# Patient Record
Sex: Female | Born: 1975 | Race: Black or African American | Hispanic: No | Marital: Single | Smoking: Never smoker
Health system: Southern US, Community
[De-identification: ages and names within clinical notes are randomized; demographics above are authoritative.]

## PROBLEM LIST (undated history)

## (undated) DIAGNOSIS — R7303 Prediabetes: Secondary | ICD-10-CM

## (undated) DIAGNOSIS — R739 Hyperglycemia, unspecified: Secondary | ICD-10-CM

## (undated) DIAGNOSIS — N6019 Diffuse cystic mastopathy of unspecified breast: Secondary | ICD-10-CM

## (undated) DIAGNOSIS — I10 Essential (primary) hypertension: Secondary | ICD-10-CM

## (undated) DIAGNOSIS — D649 Anemia, unspecified: Secondary | ICD-10-CM

## (undated) HISTORY — PX: ESOPHAGOGASTRODUODENOSCOPY: SHX1529

## (undated) HISTORY — PX: OTHER SURGICAL HISTORY: SHX169

## (undated) HISTORY — PX: BREAST CYST EXCISION: SHX579

## (undated) HISTORY — PX: CHOLECYSTECTOMY: SHX55

## (undated) HISTORY — PX: ESSURE TUBAL LIGATION: SUR464

## (undated) HISTORY — PX: GASTRIC ROUX-EN-Y: SHX5262

---

## 2012-12-02 ENCOUNTER — Emergency Department: Payer: Self-pay | Admitting: Emergency Medicine

## 2012-12-02 LAB — CBC
MCH: 27.6 pg (ref 26.0–34.0)
MCHC: 33.9 g/dL (ref 32.0–36.0)
Platelet: 340 10*3/uL (ref 150–440)
WBC: 6.8 10*3/uL (ref 3.6–11.0)

## 2012-12-02 LAB — COMPREHENSIVE METABOLIC PANEL
Albumin: 3.7 g/dL (ref 3.4–5.0)
Anion Gap: 12 (ref 7–16)
BUN: 13 mg/dL (ref 7–18)
Calcium, Total: 8.7 mg/dL (ref 8.5–10.1)
Chloride: 109 mmol/L — ABNORMAL HIGH (ref 98–107)
Co2: 20 mmol/L — ABNORMAL LOW (ref 21–32)
Creatinine: 0.78 mg/dL (ref 0.60–1.30)
EGFR (African American): >60
Glucose: 124 mg/dL — ABNORMAL HIGH (ref 65–99)
Osmolality: 283 (ref 275–301)
SGPT (ALT): 22 U/L (ref 12–78)
Sodium: 141 mmol/L (ref 136–145)
Total Protein: 7.9 g/dL (ref 6.4–8.2)

## 2012-12-02 LAB — URINALYSIS, COMPLETE
Glucose,UR: NEGATIVE mg/dL (ref 0–75)
Nitrite: NEGATIVE
RBC,UR: 20 /HPF (ref 0–5)
Squamous Epithelial: 19
WBC UR: 10 /HPF (ref 0–5)

## 2012-12-02 LAB — PREGNANCY, URINE: Pregnancy Test, Urine: NEGATIVE m[IU]/mL

## 2013-12-19 ENCOUNTER — Emergency Department (HOSPITAL_COMMUNITY)
Admission: EM | Admit: 2013-12-19 | Discharge: 2013-12-19 | Disposition: A | Discharge status: Home / Self Care | Payer: BC Managed Care – PPO | Source: Home / Self Care | Attending: Family Medicine | Admitting: Family Medicine

## 2013-12-19 ENCOUNTER — Encounter (HOSPITAL_COMMUNITY): Payer: Self-pay | Admitting: Emergency Medicine

## 2013-12-19 DIAGNOSIS — Z202 Contact with and (suspected) exposure to infections with a predominantly sexual mode of transmission: Secondary | ICD-10-CM

## 2013-12-19 MED ORDER — PENICILLIN G BENZATHINE 1200000 UNIT/2ML IM SUSP
INTRAMUSCULAR | Status: AC
  Filled 2013-12-19: qty 4

## 2013-12-19 MED ORDER — PENICILLIN G BENZATHINE 1200000 UNIT/2ML IM SUSP
2.4000 10*6.[IU] | Freq: Once | INTRAMUSCULAR | Status: AC
  Administered 2013-12-19: 2.4 10*6.[IU] via INTRAMUSCULAR

## 2013-12-19 NOTE — ED Provider Notes (Signed)
CSN: [id number]     Arrival date & time 12/19/13  1705 History   First MD Initiated Contact with Patient 12/19/13 1741     Chief Complaint  Patient presents with  . Exposure to STD     (Consider location/radiation/quality/duration/timing/severity/associated sxs/prior Treatment) HPI Comments: 38 year old female presents for treatment, her boyfriend called her and told her that he had tested positive for syphilis. They have been sexually active during the time where he had a penile lesion. She denies any symptoms herself. All of his other tests including HIV, GC, CH, trichomoniasis, HSV were all negative. She has no drug allergies  Patient is a 38 y.o. female presenting with STD exposure.  Exposure to STD Pertinent negatives include no chest pain, no abdominal pain and no shortness of breath.    History reviewed. No pertinent past medical history. Past Surgical History  Procedure Laterality Date  . Gastric bypass      2010   History reviewed. No pertinent family history. History  Substance Use Topics  . Smoking status: Never Smoker   . Smokeless tobacco: Not on file  . Alcohol Use: No   OB History   Grav Para Term Preterm Abortions TAB SAB Ect Mult Living                 Review of Systems  Constitutional: Negative for fever and chills.  Eyes: Negative for visual disturbance.  Respiratory: Negative for cough and shortness of breath.   Cardiovascular: Negative for chest pain, palpitations and leg swelling.  Gastrointestinal: Negative for nausea, vomiting and abdominal pain.  Endocrine: Negative for polydipsia and polyuria.  Genitourinary: Negative for dysuria, urgency, frequency, hematuria, flank pain, vaginal bleeding, vaginal discharge, difficulty urinating, genital sores, vaginal pain, menstrual problem, pelvic pain and dyspareunia.  Musculoskeletal: Negative for arthralgias and myalgias.  Skin: Negative for rash.  Neurological: Negative for dizziness, weakness and  light-headedness.      Allergies  Review of patient's allergies indicates no known allergies.  Home Medications  No current outpatient prescriptions on file. BP 135/91  Pulse 102  Temp(Src) 97.8 F (36.6 C) (Oral)  Resp 20  SpO2 100%  LMP 12/06/2013 Physical Exam  Nursing note and vitals reviewed. Constitutional: She is oriented to person, place, and time. Vital signs are normal. She appears well-developed and well-nourished. No distress.  HENT:  Head: Normocephalic and atraumatic.  Pulmonary/Chest: Effort normal. No respiratory distress.  Neurological: She is alert and oriented to person, place, and time. She has normal strength. Coordination normal.  Skin: Skin is warm and dry. No rash noted. She is not diaphoretic.  Psychiatric: She has a normal mood and affect. Judgment normal.    ED Course  Procedures (including critical care time) Labs Review Labs Reviewed  RPR  HIV ANTIBODY (ROUTINE TESTING)   Imaging Review No results found.    MDM   Final diagnoses:  Exposure to syphilis   Given Bicillin, 2.4 million units IM. HIV and RPR sent. Followup when necessary  Meds ordered this encounter  Medications  . penicillin g benzathine (BICILLIN LA) 1200000 UNIT/2ML injection 2.4 Million Units    Sig:     Order Specific Question:  Antibiotic Indication:    Answer:  Syphilis       Liam Graham, PA-C 12/19/13 1758

## 2013-12-19 NOTE — Discharge Instructions (Signed)
Syphilis Syphilis is an infectious disease. It can cause serious complications if left untreated.  CAUSES  Syphilis is caused by a type of bacteria called Treponema pallidum. It is most commonly spread through sexual contact. Syphilis may also spread to a fetus through the blood of the mother.  SIGNS AND SYMPTOMS Symptoms vary depending on the stage of the disease. Some symptoms may disappear without treatment. However, this does not mean that the infection is gone. One form of syphilis (called latent syphilis) has no symptoms.  Primary Syphilis  Painless sores (chancres) in and around the genital organs and mouth.  Swollen lymph nodes near the sores. Secondary Syphilis  A rash or sores over any portion of the body, including the palms of the hands and soles of the feet.  Fever.  Headache.  Sore throat.  Swollen lymph nodes.  New sores in the mouth or on the genitals.  Feeling generally ill.  Having pain in the joints. Tertiary Syphilis The third stage of syphilis involves severe damage to different organs in the body, such as the brain, spinal cord, and heart. Signs and symptoms may include:   Dementia.  Personality and mood changes.  Difficulty walking.  Heart failure.  Fainting.  Enlargement (aneurysm) of the aorta.  Tumors of the skin, bones, or liver.  Muscle weakness.  Sudden "lightning" pains, numbness, or tingling.  Problems with coordination.  Vision changes. DIAGNOSIS   A physical exam will be done.  Blood tests will be done to confirm the diagnosis.  If the disease is in the first or second stages, a fluid (drainage) sample from a sore or rash may be examined under a microscope to detect the disease-causing bacteria.  Fluid around the spine may need to be examined to detect brain damage or inflammation of the brain lining (meningitis).  If the disease is in the third stage, X-rays, CT scans, MRIs, echocardiograms, ultrasounds, or cardiac  catheterization may also be done to detect disease of the heart, aorta, or brain. TREATMENT  Syphilis can be cured with antibiotic medicine if a diagnosis is made early. During the first day of treatment, you may experience fever, chills, headache, nausea, or aching all over your body. This is a normal reaction to the antibiotics.  HOME CARE INSTRUCTIONS   Take antibiotics as directed by your health care provider. Make sure you finish them, even if you start to feel better. Incomplete treatment will put you at risk for continued infection and could be life-threatening.  Only take over-the-counter or prescription medicines for pain, discomfort, or fever as directed by your health care provider.  Do not have sexual intercourse until your treatment is completed or as directed by your health care provider.  Inform your recent sexual partners that you were diagnosed with syphilis. They need to seek care and treatment, even if they have no symptoms. It is necessary that all your sexual partners be tested for infection and treated if they have the disease.  Follow up with your health care provider as directed.  If your test results are not ready during your visit, make an appointment with your health care provider to find out the results. Do not assume everything is normal if you have not heard from your health care provider or the medical facility. It is important for you to follow up on all of your test results. SEEK MEDICAL CARE IF:  You continue to have any of the following 24 hours after beginning treatment:  Fever.  Chills.  Headache.  Nausea.  Aching all over your body.  You have symptoms of an allergic reaction to medicine, such as:  Chills.  A headache.  Lightheadedness.  A new rash (especially hives).  Difficulty breathing. SEEK IMMEDIATE MEDICAL CARE IF:   You have a fever or persistent symptoms for more than 2 3 days.  You have a fever and your symptoms suddenly get  worse. Document Released: 08/16/2013 Document Reviewed: 05/16/2013 [facility] Patient Information 2014 [place], [place].

## 2013-12-19 NOTE — ED Notes (Signed)
Reports being informed by boyfriend today that he tested positive for syphilis.  Denies any symptoms at this time.  States was recently treated for trich.

## 2013-12-20 LAB — RPR: RPR Ser Ql: NONREACTIVE

## 2013-12-20 LAB — HIV ANTIBODY (ROUTINE TESTING W REFLEX): HIV: NONREACTIVE

## 2013-12-20 NOTE — ED Provider Notes (Signed)
Medical screening examination/treatment/procedure(s) were performed by a resident physician or non-physician practitioner and as the supervising physician I was immediately available for consultation/collaboration.  Evan Corey, MD    Evan S Corey, MD 12/20/13 0745 

## 2013-12-21 ENCOUNTER — Telehealth (HOSPITAL_COMMUNITY): Payer: Self-pay | Admitting: *Deleted

## 2013-12-21 NOTE — ED Notes (Signed)
Pt. called for her lab results.  Pt. verified x 2 and given results ( HIV/RPR non-reactive). Roselyn Meier 12/21/2013

## 2014-06-26 ENCOUNTER — Emergency Department: Payer: Self-pay | Admitting: Emergency Medicine

## 2014-09-10 ENCOUNTER — Ambulatory Visit: Payer: Self-pay | Admitting: Physician Assistant

## 2014-10-09 ENCOUNTER — Ambulatory Visit: Payer: Self-pay | Admitting: Physician Assistant

## 2016-06-02 DIAGNOSIS — Z046 Encounter for general psychiatric examination, requested by authority: Secondary | ICD-10-CM | POA: Diagnosis present

## 2016-06-02 DIAGNOSIS — Z79899 Other long term (current) drug therapy: Secondary | ICD-10-CM | POA: Insufficient documentation

## 2016-06-02 DIAGNOSIS — Y808 Miscellaneous physical medicine devices associated with adverse incidents, not elsewhere classified: Secondary | ICD-10-CM | POA: Insufficient documentation

## 2016-06-02 DIAGNOSIS — T402X5A Adverse effect of other opioids, initial encounter: Secondary | ICD-10-CM | POA: Diagnosis not present

## 2016-06-02 DIAGNOSIS — T402X1A Poisoning by other opioids, accidental (unintentional), initial encounter: Secondary | ICD-10-CM | POA: Diagnosis not present

## 2016-06-02 DIAGNOSIS — T887XXA Unspecified adverse effect of drug or medicament, initial encounter: Secondary | ICD-10-CM | POA: Diagnosis not present

## 2016-06-02 NOTE — ED Triage Notes (Signed)
Pt took 4 x hydrocodone at 2215 has been upset about recent breakup and other stressors, denies any SI at this time.

## 2016-06-03 ENCOUNTER — Emergency Department
Admission: EM | Admit: 2016-06-03 | Discharge: 2016-06-03 | Disposition: A | Discharge status: Home / Self Care | Payer: BC Managed Care – PPO | Attending: Emergency Medicine | Admitting: Emergency Medicine

## 2016-06-03 DIAGNOSIS — T3991XA Poisoning by unspecified nonopioid analgesic, antipyretic and antirheumatic, accidental (unintentional), initial encounter: Secondary | ICD-10-CM

## 2016-06-03 LAB — BASIC METABOLIC PANEL
Anion gap: 9 (ref 5–15)
BUN: 19 mg/dL (ref 6–20)
CALCIUM: 8.7 mg/dL — AB (ref 8.9–10.3)
CO2: 21 mmol/L — AB (ref 22–32)
Chloride: 108 mmol/L (ref 101–111)
Creatinine, Ser: 0.99 mg/dL (ref 0.44–1.00)
GFR calc Af Amer: >60 mL/min (ref >60)
GFR calc non Af Amer: >60 mL/min (ref >60)
GLUCOSE: 114 mg/dL — AB (ref 65–99)
Potassium: 3.5 mmol/L (ref 3.5–5.1)
Sodium: 138 mmol/L (ref 135–145)

## 2016-06-03 LAB — CBC
HEMATOCRIT: 31.7 % — AB (ref 35.0–47.0)
Hemoglobin: 10.2 g/dL — ABNORMAL LOW (ref 12.0–16.0)
MCH: 22.6 pg — ABNORMAL LOW (ref 26.0–34.0)
MCHC: 32.1 g/dL (ref 32.0–36.0)
MCV: 70.6 fL — ABNORMAL LOW (ref 80.0–100.0)
Platelets: 448 10*3/uL — ABNORMAL HIGH (ref 150–440)
RBC: 4.49 MIL/uL (ref 3.80–5.20)
RDW: 17.6 % — ABNORMAL HIGH (ref 11.5–14.5)
WBC: 10.4 10*3/uL (ref 3.6–11.0)

## 2016-06-03 LAB — URINE DRUG SCREEN, QUALITATIVE (ARMC ONLY)
Amphetamines, Ur Screen: NOT DETECTED
Barbiturates, Ur Screen: NOT DETECTED
Benzodiazepine, Ur Scrn: NOT DETECTED
Cannabinoid 50 Ng, Ur ~~LOC~~: NOT DETECTED
Cocaine Metabolite,Ur ~~LOC~~: NOT DETECTED
MDMA (ECSTASY) UR SCREEN: NOT DETECTED
Methadone Scn, Ur: NOT DETECTED
Opiate, Ur Screen: POSITIVE — AB
Phencyclidine (PCP) Ur S: NOT DETECTED
TRICYCLIC, UR SCREEN: NOT DETECTED

## 2016-06-03 LAB — URINALYSIS COMPLETE WITH MICROSCOPIC (ARMC ONLY)
Bilirubin Urine: NEGATIVE
GLUCOSE, UA: NEGATIVE mg/dL
Ketones, ur: NEGATIVE mg/dL
Nitrite: NEGATIVE
Protein, ur: NEGATIVE mg/dL
Specific Gravity, Urine: 1.026 (ref 1.005–1.030)
pH: 5 (ref 5.0–8.0)

## 2016-06-03 LAB — ETHANOL: Alcohol, Ethyl (B): <5 mg/dL (ref <5)

## 2016-06-03 LAB — SALICYLATE LEVEL

## 2016-06-03 LAB — ACETAMINOPHEN LEVEL: ACETAMINOPHEN (TYLENOL), SERUM: 14 ug/mL (ref 10–30)

## 2016-06-03 NOTE — ED Provider Notes (Addendum)
[facility] Emergency Department Provider Note  ____________________________________________  Time seen: Approximately 3:48 AM  I have reviewed the triage vital signs and the nursing notes.   HISTORY  Chief Complaint Psychiatric Evaluation    HPI Debra Swanson is a 40 y.o. female comes to the ED due to an overdose of hydrocodone. This was at about 11 PM. She had just spoken with her fianc who abruptly broke off the engagement and she is feeling very upset. She denies SI HI hallucinations, denies trying to kill herself. She states that her mind set was "I don't want to feel this pain anymore" so she took 4 Vicodin. No history of any depression or attempts at self-harm. No other coingestions. She immediately called a friend who encouraged her to come to the emergency room. The patient also felt immediately remorseful of her impulsive act and induced vomiting.       No past medical history on file. Morbid obesity  There are no active problems to display for this patient.    Past Surgical History:  Procedure Laterality Date  . gastric bypass     2010     Prior to Admission medications   Medication Sig Start Date End Date Taking? Authorizing Provider  chlorhexidine (PERIDEX) 0.12 % solution 30 mLs by Mouth Rinse route 2 (two) times daily. 05/26/16  Yes Historical Provider, MD  HYDROcodone-acetaminophen (NORCO/VICODIN) 5-325 MG tablet Take 1 tablet by mouth every 4 (four) hours as needed. 05/26/16  Yes Historical Provider, MD  ibuprofen (ADVIL,MOTRIN) 600 MG tablet Take 1 tablet by mouth every 6 (six) hours as needed. 05/26/16  Yes Historical Provider, MD     Allergies Review of patient's allergies indicates no known allergies.   No family history on file.  Social History Social History  Substance Use Topics  . Smoking status: Never Smoker  . Smokeless tobacco: Not on file  . Alcohol use No    Review of Systems  Constitutional:    No fever or chills.  ENT:   No sore throat. No rhinorrhea. Cardiovascular:   No chest pain. Respiratory:   No dyspnea or cough. Gastrointestinal:   Negative for abdominal pain, vomiting and diarrhea.  Genitourinary:   Negative for dysuria or difficulty urinating. Musculoskeletal:   Negative for focal pain or swelling Neurological:   Negative for headaches 10-point ROS otherwise negative.  ____________________________________________   PHYSICAL EXAM:  VITAL SIGNS: ED Triage Vitals [06/02/16 2347]  Enc Vitals Group     BP 129/88     Pulse Rate 89     Resp 18     Temp 99 F (37.2 C)     Temp Source Oral     SpO2 96 %     Weight (!) 330 lb (149.7 kg)     Height 5\' 4"  (1.626 m)     Head Circumference      Peak Flow      Pain Score      Pain Loc      Pain Edu?      Excl. in [place]?     Vital signs reviewed, nursing assessments reviewed.   Constitutional:   Alert and oriented. Well appearing and in no distress. Eyes:   No scleral icterus. No conjunctival pallor. PERRL. EOMI.  No nystagmus. ENT   Head:   Normocephalic and atraumatic.   Nose:   No congestion/rhinnorhea. No septal hematoma   Mouth/Throat:   MMM, no pharyngeal erythema. No peritonsillar mass.    Neck:  No stridor. No SubQ emphysema. No meningismus. Hematological/Lymphatic/Immunilogical:   No cervical lymphadenopathy. Cardiovascular:   RRR. Symmetric bilateral radial and DP pulses.  No murmurs.  Respiratory:   Normal respiratory effort without tachypnea nor retractions. Breath sounds are clear and equal bilaterally. No wheezes/rales/rhonchi. Gastrointestinal:   Soft and nontender. Non distended. There is no CVA tenderness.  No rebound, rigidity, or guarding. Genitourinary:   deferred Musculoskeletal:   Nontender with normal range of motion in all extremities. No joint effusions.  No lower extremity tenderness.  No edema. Neurologic:   Normal speech and language.  CN 2-10 normal. Motor grossly  intact. No gross focal neurologic deficits are appreciated.  Skin:    Skin is warm, dry and intact. No rash noted.  No petechiae, purpura, or bullae. Psychiatric: Future oriented, calm lucid and interactive with good insight and judgment. Linear thought.  ____________________________________________    LABS (pertinent positives/negatives) (all labs ordered are listed, but only abnormal results are displayed) Labs Reviewed  CBC - Abnormal; Notable for the following:       Result Value   Hemoglobin 10.2 (*)    HCT 31.7 (*)    MCV 70.6 (*)    MCH 22.6 (*)    RDW 17.6 (*)    Platelets 448 (*)    All other components within normal limits  BASIC METABOLIC PANEL - Abnormal; Notable for the following:    CO2 21 (*)    Glucose, Bld 114 (*)    Calcium 8.7 (*)    All other components within normal limits  URINALYSIS COMPLETEWITH MICROSCOPIC ([facility] ONLY) - Abnormal; Notable for the following:    Color, Urine YELLOW (*)    APPearance HAZY (*)    Hgb urine dipstick 1+ (*)    Leukocytes, UA TRACE (*)    Bacteria, UA RARE (*)    Squamous Epithelial / LPF 6-30 (*)    All other components within normal limits  URINE DRUG SCREEN, QUALITATIVE ([facility] ONLY) - Abnormal; Notable for the following:    Opiate, Ur Screen POSITIVE (*)    All other components within normal limits  ETHANOL  ACETAMINOPHEN LEVEL  SALICYLATE LEVEL  PREGNANCY, URINE   ____________________________________________   EKG    ____________________________________________    RADIOLOGY    ____________________________________________   PROCEDURES Procedures  ____________________________________________   INITIAL IMPRESSION / ASSESSMENT AND PLAN / ED COURSE  Pertinent labs & imaging results that were available during my care of the patient were reviewed by me and considered in my medical decision making (see chart for details).  Patient calm comfortable, no acute complaints, medically clear. She arrives under  involuntary commitment and initiated by police who responded to her home. However, my impression is that this was an impulsive act and that she had no intent to harm herself and she denies any ongoing suicidality. I obtained a psychiatric consultation to help confirm this assessment, and reviewed their consult note and recommendations. They agree she is psychiatrically stable and not a danger to herself or others and that the IVC can be rescinded. We'll have the patient follow up with primary care for ongoing assessment. No evidence of any significant toxicity. Low suspicion for Tylenol overdose or toxicity. She is eager to get home before her daughter gets off at work at 5:30 AM so she can help celebrate her daughter's birthday.     Clinical Course   ____________________________________________   FINAL CLINICAL IMPRESSION(S) / ED DIAGNOSES  Final diagnoses:  Overdose of analgesic or antipyretic,  initial encounter       Portions of this note were generated with dragon dictation software. Dictation errors may occur despite best attempts at proofreading.   Carrie Mew, MD 06/03/16 [place], MD 06/03/16 [phone]

## 2016-06-03 NOTE — ED Notes (Signed)
[employer] officer arrived to take patient home.

## 2016-06-03 NOTE — ED Notes (Signed)
Patient states the officer that picked her up said he could come back to take her home after tx complete.  I am calling [employer] to inquire about a ride home for her.  She has no other transportation or way home, at any time of day.

## 2016-06-03 NOTE — ED Notes (Signed)
[employer] will take patient back home, gave her clothes to change out into and then will dc to lobby.

## 2016-06-03 NOTE — ED Notes (Signed)

## 2016-06-03 NOTE — ED Notes (Signed)
SOC called and asked for an update for this patient, asking about drug tox screen and why the patient came to the ED.  She will be initiating the call after this conversation.

## 2016-06-03 NOTE — ED Notes (Signed)
SOC MD called to inform me the [facility] was complete.

## 2017-04-18 ENCOUNTER — Emergency Department
Admission: EM | Admit: 2017-04-18 | Discharge: 2017-04-18 | Disposition: A | Discharge status: Home / Self Care | Payer: BC Managed Care – PPO | Attending: Emergency Medicine | Admitting: Emergency Medicine

## 2017-04-18 ENCOUNTER — Emergency Department: Payer: BC Managed Care – PPO

## 2017-04-18 ENCOUNTER — Encounter: Payer: Self-pay | Admitting: Emergency Medicine

## 2017-04-18 DIAGNOSIS — M25561 Pain in right knee: Secondary | ICD-10-CM | POA: Insufficient documentation

## 2017-04-18 MED ORDER — HYDROCODONE-ACETAMINOPHEN 5-325 MG PO TABS: 1.0000 | ORAL_TABLET | ORAL | 0 refills | Status: DC | PRN

## 2017-04-18 MED ORDER — HYDROCODONE-ACETAMINOPHEN 5-325 MG PO TABS
2.0000 | ORAL_TABLET | Freq: Once | ORAL | Status: AC
  Administered 2017-04-18: 2 via ORAL
  Filled 2017-04-18: qty 2

## 2017-04-18 NOTE — ED Notes (Signed)
Pt. States about 7 pm this evening pt. Was standing from sitting position and heard 3 pops in rt. Knee.  Pt. States pain to rt. Knee.  Pt. Denies hx of injury to rt. Knee.  No obvious deformity to rt. Knee.  Pt. Has applied ice to extremity with little relief.

## 2017-04-18 NOTE — ED Notes (Signed)
Pt. Given walker instead of crutches due to weight.  Pt. Given instructions with walker.  Pt. Going home with husband.

## 2017-04-18 NOTE — ED Provider Notes (Signed)
[facility] Emergency Department Provider Note  Time seen: 4:53 AM  I have reviewed the triage vital signs and the nursing notes.   HISTORY  Chief Complaint Knee Pain    HPI Debra Swanson is a 41 y.o. female presents to the emergency department with right knee pain. According to the patient were [place] the beach [place], she was sitting in a chair she went to stand up and while she straightened her leg had immediate pain to the right knee. Describes the pain as moderate at worse with any attempted movement. Denies any fall or other trauma to the knee. Denies any other injuries.  History reviewed. No pertinent past medical history.  There are no active problems to display for this patient.   Past Surgical History:  Procedure Laterality Date  . gastric bypass     2010    Prior to Admission medications   Medication Sig Start Date End Date Taking? Authorizing Provider  chlorhexidine (PERIDEX) 0.12 % solution 30 mLs by Mouth Rinse route 2 (two) times daily. 05/26/16   [provider]  HYDROcodone-acetaminophen (NORCO/VICODIN) 5-325 MG tablet Take 1 tablet by mouth every 4 (four) hours as needed. 05/26/16   [provider]  ibuprofen (ADVIL,MOTRIN) 600 MG tablet Take 1 tablet by mouth every 6 (six) hours as needed. 05/26/16   [provider]    No Known Allergies  History reviewed. No pertinent family history.  Social History Social History  Substance Use Topics  . Smoking status: Never Smoker  . Smokeless tobacco: Never Used  . Alcohol use No    Review of Systems Constitutional:Negative for LOC. Cardiovascular: Negative for chest pain. Respiratory: Negative for recent illness Gastrointestinal: Negative for abdominal pain Musculoskeletal: Right knee pain Skin: Negative for rash. All other ROS negative  ____________________________________________   PHYSICAL EXAM:  VITAL SIGNS: ED Triage Vitals  [04/18/17 0018]  Enc Vitals Group     BP (!) 143/90     Pulse Rate 82     Resp 16     Temp 98.7 F (37.1 C)     Temp Source Oral     SpO2 99 %     Weight (!) 340 lb (154.2 kg)     Height 5\' 7"  (1.702 m)     Head Circumference      Peak Flow      Pain Score 8     Pain Loc      Pain Edu?      Excl. in [place]?     Constitutional: Alert and oriented. Well appearing and in no distress. Eyes: Normal exam ENT   Head: Normocephalic and atraumatic.   Mouth/Throat: Mucous membranes are moist. Cardiovascular: Normal rate, regular rhythm. No murmur Respiratory: Normal respiratory effort without tachypnea nor retractions. Breath sounds are clear Gastrointestinal: Soft and nontender. No distention. Musculoskeletal: Moderate tenderness palpation the right knee. No appreciable lower extremity edema. Nontender. Neurovascular Intact 2+ DP Pulse. Neurologic:  Normal speech and language. No gross focal neurologic deficits are appreciated. Skin:  Skin is warm, dry and intact.  Psychiatric: Mood and affect are normal.  ____________________________________________    RADIOLOGY  X-ray shows joint effusion, otherwise negative  ____________________________________________   INITIAL IMPRESSION / ASSESSMENT AND PLAN / ED COURSE  Pertinent labs & imaging results that were available during my care of the patient were reviewed by me and considered in my medical decision making (see chart for details).  Patient presents to emergency department with essentially an  atraumatic right knee injury. Patient has an effusion on x-ray. Moderate tenderness on exam worse with any attempted range of motion active or passive. Highly suspect knee sprain versus cartilaginous/ligamentous injury. We will place on crutches with weightbearing as tolerated, discharged with pain medication and have the patient follow up with orthopedics for further evaluation. Patient  agreeable.  ____________________________________________   FINAL CLINICAL IMPRESSION(S) / ED DIAGNOSES  Right knee injury    Harvest Dark, MD 04/18/17 [phone]

## 2017-04-18 NOTE — ED Notes (Signed)
Pt co of pain, constant w/ an 8 on a pain scale of 0-10. Currently pt's only complaint. Pt has been updated on waiting room status. VS updated

## 2017-04-18 NOTE — ED Triage Notes (Signed)
Pt to triage via w/c, report was sitting in beach chair and when she stood up felt three pops in right knee and now difficult to bear weight.  No obvious swelling or deformity.  CSM intact.

## 2017-10-11 ENCOUNTER — Other Ambulatory Visit: Payer: Self-pay | Admitting: Sports Medicine

## 2017-10-11 DIAGNOSIS — M25561 Pain in right knee: Principal | ICD-10-CM

## 2017-10-11 DIAGNOSIS — G8929 Other chronic pain: Secondary | ICD-10-CM

## 2017-10-18 ENCOUNTER — Ambulatory Visit
Admission: RE | Admit: 2017-10-18 | Discharge: 2017-10-18 | Disposition: A | Discharge status: Home / Self Care | Payer: BC Managed Care – PPO | Source: Ambulatory Visit | Attending: Sports Medicine | Admitting: Sports Medicine

## 2017-10-18 DIAGNOSIS — M25561 Pain in right knee: Secondary | ICD-10-CM | POA: Insufficient documentation

## 2017-10-18 DIAGNOSIS — G8929 Other chronic pain: Secondary | ICD-10-CM | POA: Insufficient documentation

## 2017-10-18 DIAGNOSIS — S83251A Bucket-handle tear of lateral meniscus, current injury, right knee, initial encounter: Secondary | ICD-10-CM | POA: Insufficient documentation

## 2017-10-18 DIAGNOSIS — M948X6 Other specified disorders of cartilage, lower leg: Secondary | ICD-10-CM | POA: Diagnosis not present

## 2017-10-20 ENCOUNTER — Other Ambulatory Visit: Payer: Self-pay | Admitting: Sports Medicine

## 2017-10-20 DIAGNOSIS — G8929 Other chronic pain: Secondary | ICD-10-CM

## 2017-10-20 DIAGNOSIS — M25561 Pain in right knee: Principal | ICD-10-CM

## 2017-11-12 ENCOUNTER — Encounter
Admission: RE | Admit: 2017-11-12 | Discharge: 2017-11-12 | Disposition: A | Discharge status: Home / Self Care | Payer: BC Managed Care – PPO | Source: Ambulatory Visit | Attending: Orthopedic Surgery | Admitting: Orthopedic Surgery

## 2017-11-12 ENCOUNTER — Other Ambulatory Visit: Payer: Self-pay

## 2017-11-12 DIAGNOSIS — Z01812 Encounter for preprocedural laboratory examination: Secondary | ICD-10-CM | POA: Diagnosis present

## 2017-11-12 DIAGNOSIS — E119 Type 2 diabetes mellitus without complications: Secondary | ICD-10-CM | POA: Diagnosis not present

## 2017-11-12 DIAGNOSIS — I10 Essential (primary) hypertension: Secondary | ICD-10-CM | POA: Diagnosis not present

## 2017-11-12 DIAGNOSIS — R9431 Abnormal electrocardiogram [ECG] [EKG]: Secondary | ICD-10-CM | POA: Insufficient documentation

## 2017-11-12 DIAGNOSIS — Z01810 Encounter for preprocedural cardiovascular examination: Secondary | ICD-10-CM | POA: Insufficient documentation

## 2017-11-12 HISTORY — DX: Essential (primary) hypertension: I10

## 2017-11-12 HISTORY — DX: Hyperglycemia, unspecified: R73.9

## 2017-11-12 HISTORY — DX: Anemia, unspecified: D64.9

## 2017-11-12 HISTORY — DX: Prediabetes: R73.03

## 2017-11-12 HISTORY — DX: Diffuse cystic mastopathy of unspecified breast: N60.19

## 2017-11-12 LAB — CBC
HCT: 29.6 % — ABNORMAL LOW (ref 35.0–47.0)
Hemoglobin: 9.3 g/dL — ABNORMAL LOW (ref 12.0–16.0)
MCH: 20.6 pg — AB (ref 26.0–34.0)
MCHC: 31.4 g/dL — ABNORMAL LOW (ref 32.0–36.0)
MCV: 65.6 fL — ABNORMAL LOW (ref 80.0–100.0)
PLATELETS: 533 10*3/uL — AB (ref 150–440)
RBC: 4.51 MIL/uL (ref 3.80–5.20)
RDW: 17.8 % — ABNORMAL HIGH (ref 11.5–14.5)
WBC: 7.5 10*3/uL (ref 3.6–11.0)

## 2017-11-12 LAB — BASIC METABOLIC PANEL
Anion gap: 10 (ref 5–15)
BUN: 19 mg/dL (ref 6–20)
CO2: 23 mmol/L (ref 22–32)
Calcium: 8.8 mg/dL — ABNORMAL LOW (ref 8.9–10.3)
Chloride: 105 mmol/L (ref 101–111)
Creatinine, Ser: 0.8 mg/dL (ref 0.44–1.00)
Glucose, Bld: 110 mg/dL — ABNORMAL HIGH (ref 65–99)
POTASSIUM: 3.9 mmol/L (ref 3.5–5.1)
SODIUM: 138 mmol/L (ref 135–145)

## 2017-11-12 NOTE — Pre-Procedure Instructions (Addendum)
Ekg reviewed by Dr Randa Lynn. Request medical clearance. Hope at [facility] notified and clearance request faxed, she will contact patients PMD.

## 2017-11-12 NOTE — Patient Instructions (Signed)
Your procedure is scheduled on: Friday 11/19/17 Report to [place]. 2ND FLOOR MEDICAL MALL ENTRANCE. To find out your arrival time please call [phone] between 1PM - 3PM on Thursday 11/18/17.  Remember: Instructions that are not followed completely may result in serious medical risk, up to and including death, or upon the discretion of your surgeon and anesthesiologist your surgery may need to be rescheduled.    __X__ 1. Do not eat anything after midnight the night before your    procedure.  No gum chewing or hard candies.  You may drink clear   liquids up to 2 hours before you are scheduled to arrive at the   hospital for your procedure. Do not drink clear liquids within 2   hours of scheduled arrival to the hospital as this may lead to your   procedure being delayed or rescheduled.       Clear liquids include:   Water or Apple juice without pulp   Clear carbohydrate beverage such as Clearfast or Gatorade   Black coffee or Clear Tea (no milk, no creamer, do not add anything   to the coffee or tea)    Diabetics should only drink water   __X__ 2. No Alcohol for 24 hours before or after surgery.   ____ 3. Bring all medications with you on the day of surgery if instructed.    __X__ 4. Notify your doctor if there is any change in your medical condition     (cold, fever, infections).             __X___5. No smoking within 24 hours of your surgery.     Do not wear jewelry, make-up, hairpins, clips or nail polish.  Do not wear lotions, powders, or perfumes.   Do not shave 48 hours prior to surgery. Men may shave face and neck.  Do not bring valuables to the hospital.    [facility] is not responsible for any belongings or valuables.               Contacts, dentures or bridgework may not be worn into surgery.  Leave your suitcase in the car. After surgery it may be brought to your room.  For patients admitted to the hospital, discharge time is determined by your                 treatment team.   Patients discharged the day of surgery will not be allowed to drive home.   Please read over the following fact sheets that you were given:   MRSA Information   ____ Take these medicines the morning of surgery with A SIP OF WATER:    1. none  2.   3.   4.  5.  6.  ____ Fleet Enema (as directed)   __X__ Use CHG Soap/SAGE wipes as directed  ____ Use inhalers on the day of surgery  ____ Stop metformin 2 days prior to surgery    ____ Take 1/2 of usual insulin dose the night before surgery and none on the morning of surgery.   ____ Stop Coumadin/Plavix/aspirin on   __X__ Stop Anti-inflammatories such as Advil, Aleve, Ibuprofen, Motrin, Naproxen, Naprosyn, Goodies,powder, or aspirin products.  OK to take Tylenol.   __X__ Stop supplements, Vitamin E, Fish Oil until after surgery.    ____ Bring C-Pap to the hospital.

## 2017-11-18 MED ORDER — DEXTROSE 5 % IV SOLN
3.0000 g | Freq: Once | INTRAVENOUS | Status: AC
  Administered 2017-11-19: 3 g via INTRAVENOUS
  Filled 2017-11-18: qty 3000

## 2017-11-19 ENCOUNTER — Ambulatory Visit: Payer: BC Managed Care – PPO | Admitting: Anesthesiology

## 2017-11-19 ENCOUNTER — Encounter
Admission: RE | Disposition: A | Discharge status: Home / Self Care | Payer: Self-pay | Source: Ambulatory Visit | Attending: Orthopedic Surgery

## 2017-11-19 ENCOUNTER — Ambulatory Visit
Admission: RE | Admit: 2017-11-19 | Discharge: 2017-11-19 | Disposition: A | Discharge status: Home / Self Care | Payer: BC Managed Care – PPO | Source: Ambulatory Visit | Attending: Orthopedic Surgery | Admitting: Orthopedic Surgery

## 2017-11-19 DIAGNOSIS — Z6841 Body Mass Index (BMI) 40.0 and over, adult: Secondary | ICD-10-CM | POA: Insufficient documentation

## 2017-11-19 DIAGNOSIS — E119 Type 2 diabetes mellitus without complications: Secondary | ICD-10-CM | POA: Insufficient documentation

## 2017-11-19 DIAGNOSIS — I10 Essential (primary) hypertension: Secondary | ICD-10-CM | POA: Diagnosis not present

## 2017-11-19 DIAGNOSIS — M23221 Derangement of posterior horn of medial meniscus due to old tear or injury, right knee: Secondary | ICD-10-CM | POA: Diagnosis not present

## 2017-11-19 DIAGNOSIS — Z7984 Long term (current) use of oral hypoglycemic drugs: Secondary | ICD-10-CM | POA: Insufficient documentation

## 2017-11-19 DIAGNOSIS — K219 Gastro-esophageal reflux disease without esophagitis: Secondary | ICD-10-CM | POA: Diagnosis not present

## 2017-11-19 DIAGNOSIS — M1711 Unilateral primary osteoarthritis, right knee: Secondary | ICD-10-CM | POA: Diagnosis not present

## 2017-11-19 DIAGNOSIS — M23261 Derangement of other lateral meniscus due to old tear or injury, right knee: Secondary | ICD-10-CM | POA: Diagnosis not present

## 2017-11-19 HISTORY — PX: KNEE ARTHROSCOPY WITH MENISCAL REPAIR: SHX5653

## 2017-11-19 LAB — POCT PREGNANCY, URINE: Preg Test, Ur: NEGATIVE

## 2017-11-19 LAB — GLUCOSE, CAPILLARY
GLUCOSE-CAPILLARY: 102 mg/dL — AB (ref 65–99)
GLUCOSE-CAPILLARY: 99 mg/dL (ref 65–99)

## 2017-11-19 SURGERY — ARTHROSCOPY, KNEE, WITH MENISCUS REPAIR
Anesthesia: Choice | Site: Knee | Laterality: Right | Wound class: Clean

## 2017-11-19 MED ORDER — PHENYLEPHRINE HCL 10 MG/ML IJ SOLN
INTRAMUSCULAR | Status: DC | PRN
  Administered 2017-11-19: 200 ug via INTRAVENOUS
  Administered 2017-11-19: 100 ug via INTRAVENOUS
  Administered 2017-11-19: 200 ug via INTRAVENOUS
  Administered 2017-11-19 (×4): 100 ug via INTRAVENOUS

## 2017-11-19 MED ORDER — SUCCINYLCHOLINE CHLORIDE 20 MG/ML IJ SOLN
INTRAMUSCULAR | Status: DC | PRN
  Administered 2017-11-19: 140 mg via INTRAVENOUS

## 2017-11-19 MED ORDER — LIDOCAINE HCL (PF) 2 % IJ SOLN
INTRAMUSCULAR | Status: AC
  Filled 2017-11-19: qty 10

## 2017-11-19 MED ORDER — ONDANSETRON 4 MG PO TBDP: 4.0000 mg | ORAL_TABLET | Freq: Three times a day (TID) | ORAL | 0 refills | Status: DC | PRN

## 2017-11-19 MED ORDER — LIDOCAINE HCL (CARDIAC) 20 MG/ML IV SOLN
INTRAVENOUS | Status: DC | PRN
  Administered 2017-11-19: 40 mg via INTRAVENOUS

## 2017-11-19 MED ORDER — PROPOFOL 10 MG/ML IV BOLUS
INTRAVENOUS | Status: AC
  Filled 2017-11-19: qty 20

## 2017-11-19 MED ORDER — ASPIRIN EC 325 MG PO TBEC: 325.0000 mg | DELAYED_RELEASE_TABLET | Freq: Every day | ORAL | 0 refills | Status: AC

## 2017-11-19 MED ORDER — ROCURONIUM BROMIDE 50 MG/5ML IV SOLN
INTRAVENOUS | Status: AC
  Filled 2017-11-19: qty 1

## 2017-11-19 MED ORDER — PHENYLEPHRINE 8 MG IN D5W 100 ML (0.08MG/ML) PREMIX OPTIME
INJECTION | INTRAVENOUS | Status: DC | PRN
  Administered 2017-11-19: 20 ug/min via INTRAVENOUS

## 2017-11-19 MED ORDER — FAMOTIDINE 20 MG PO TABS
ORAL_TABLET | ORAL | Status: AC
  Filled 2017-11-19: qty 1

## 2017-11-19 MED ORDER — SUGAMMADEX SODIUM 200 MG/2ML IV SOLN
INTRAVENOUS | Status: DC | PRN
  Administered 2017-11-19: 308.4 mg via INTRAVENOUS

## 2017-11-19 MED ORDER — LIDOCAINE-EPINEPHRINE 1 %-1:100000 IJ SOLN
INTRAMUSCULAR | Status: DC | PRN
  Administered 2017-11-19: 6 mL
  Administered 2017-11-19: 3 mL

## 2017-11-19 MED ORDER — FENTANYL CITRATE (PF) 100 MCG/2ML IJ SOLN
INTRAMUSCULAR | Status: DC | PRN
  Administered 2017-11-19: 100 ug via INTRAVENOUS

## 2017-11-19 MED ORDER — OXYCODONE HCL 5 MG PO TABS
ORAL_TABLET | ORAL | Status: AC
  Filled 2017-11-19: qty 1

## 2017-11-19 MED ORDER — LIDOCAINE-EPINEPHRINE 1 %-1:100000 IJ SOLN
INTRAMUSCULAR | Status: AC
  Filled 2017-11-19: qty 1

## 2017-11-19 MED ORDER — PROPOFOL 10 MG/ML IV BOLUS
INTRAVENOUS | Status: DC | PRN
  Administered 2017-11-19: 150 mg via INTRAVENOUS

## 2017-11-19 MED ORDER — OXYCODONE HCL 5 MG PO TABS
ORAL_TABLET | ORAL | Status: AC
  Administered 2017-11-19: 5 mg via ORAL
  Filled 2017-11-19: qty 1

## 2017-11-19 MED ORDER — OXYCODONE HCL 5 MG PO TABS
5.0000 mg | ORAL_TABLET | Freq: Once | ORAL | Status: AC
  Administered 2017-11-19: 5 mg via ORAL
  Filled 2017-11-19: qty 1

## 2017-11-19 MED ORDER — ACETAMINOPHEN 500 MG PO TABS: 1000.0000 mg | ORAL_TABLET | Freq: Three times a day (TID) | ORAL | 2 refills | Status: DC

## 2017-11-19 MED ORDER — PHENYLEPHRINE HCL 10 MG/ML IJ SOLN
INTRAMUSCULAR | Status: AC
  Filled 2017-11-19: qty 1

## 2017-11-19 MED ORDER — BUPIVACAINE HCL (PF) 0.5 % IJ SOLN
INTRAMUSCULAR | Status: AC
  Filled 2017-11-19: qty 30

## 2017-11-19 MED ORDER — ROCURONIUM BROMIDE 100 MG/10ML IV SOLN
INTRAVENOUS | Status: DC | PRN
  Administered 2017-11-19: 5 mg via INTRAVENOUS
  Administered 2017-11-19: 45 mg via INTRAVENOUS
  Administered 2017-11-19: 20 mg via INTRAVENOUS

## 2017-11-19 MED ORDER — ONDANSETRON HCL 4 MG/2ML IJ SOLN: 4.0000 mg | Freq: Once | INTRAMUSCULAR | Status: DC | PRN

## 2017-11-19 MED ORDER — FENTANYL CITRATE (PF) 100 MCG/2ML IJ SOLN
25.0000 ug | INTRAMUSCULAR | Status: DC | PRN
  Administered 2017-11-19 (×4): 25 ug via INTRAVENOUS

## 2017-11-19 MED ORDER — MIDAZOLAM HCL 2 MG/2ML IJ SOLN
INTRAMUSCULAR | Status: AC
  Filled 2017-11-19: qty 2

## 2017-11-19 MED ORDER — FAMOTIDINE 20 MG PO TABS
20.0000 mg | ORAL_TABLET | Freq: Once | ORAL | Status: AC
  Administered 2017-11-19: 20 mg via ORAL

## 2017-11-19 MED ORDER — MIDAZOLAM HCL 2 MG/2ML IJ SOLN
INTRAMUSCULAR | Status: DC | PRN
  Administered 2017-11-19: 2 mg via INTRAVENOUS

## 2017-11-19 MED ORDER — SUCCINYLCHOLINE CHLORIDE 20 MG/ML IJ SOLN
INTRAMUSCULAR | Status: AC
  Filled 2017-11-19: qty 1

## 2017-11-19 MED ORDER — SODIUM CHLORIDE 0.9 % IV SOLN
INTRAVENOUS | Status: DC
  Administered 2017-11-19: 08:00:00 via INTRAVENOUS
  Administered 2017-11-19: 1000 mL via INTRAVENOUS

## 2017-11-19 MED ORDER — FENTANYL CITRATE (PF) 100 MCG/2ML IJ SOLN
INTRAMUSCULAR | Status: AC
  Filled 2017-11-19: qty 2

## 2017-11-19 MED ORDER — FENTANYL CITRATE (PF) 100 MCG/2ML IJ SOLN
INTRAMUSCULAR | Status: AC
  Administered 2017-11-19: 25 ug via INTRAVENOUS
  Filled 2017-11-19: qty 2

## 2017-11-19 MED ORDER — OXYCODONE HCL 5 MG PO TABS: 5.0000 mg | ORAL_TABLET | ORAL | 0 refills | Status: DC | PRN

## 2017-11-19 MED ORDER — BUPIVACAINE HCL (PF) 0.5 % IJ SOLN
INTRAMUSCULAR | Status: DC | PRN
  Administered 2017-11-19: 3 mL
  Administered 2017-11-19: 6 mL

## 2017-11-19 MED ORDER — SEVOFLURANE IN SOLN
RESPIRATORY_TRACT | Status: AC
  Filled 2017-11-19: qty 250

## 2017-11-19 MED ORDER — MIDAZOLAM HCL 2 MG/2ML IJ SOLN: INTRAMUSCULAR | Status: DC | PRN

## 2017-11-19 SURGICAL SUPPLY — 50 items
ADAPTER IRRIG TUBE 2 SPIKE SOL (ADAPTER) ×6 IMPLANT
BLADE SURG SZ11 CARB STEEL (BLADE) ×3 IMPLANT
BNDG ESMARK 6X12 TAN STRL LF (GAUZE/BANDAGES/DRESSINGS) IMPLANT
BRUSH SCRUB EZ  4% CHG (MISCELLANEOUS)
BRUSH SCRUB EZ 4% CHG (MISCELLANEOUS) IMPLANT
BUR RADIUS 3.5 (BURR) ×3 IMPLANT
BUR RADIUS 4.0X18.5 (BURR) IMPLANT
CHLORAPREP W/TINT 26ML (MISCELLANEOUS) ×3 IMPLANT
CLOSURE WOUND 1/2 X4 (GAUZE/BANDAGES/DRESSINGS)
COOLER POLAR GLACIER W/PUMP (MISCELLANEOUS) ×3 IMPLANT
CUFF TOURN 24 STER (MISCELLANEOUS) IMPLANT
CUFF TOURN 30 STER DUAL PORT (MISCELLANEOUS) IMPLANT
CUFF TOURN SGL QUICK 34 (TOURNIQUET CUFF) ×2
CUFF TRNQT CYL 34X4X40X1 (TOURNIQUET CUFF) ×1 IMPLANT
CUTTER SUT KNOT PUSHER AIR (CUTTER) ×3 IMPLANT
DEVICE MENISCAL CVD UP (Anchor) ×21 IMPLANT
DRAPE IMP U-DRAPE 54X76 (DRAPES) ×3 IMPLANT
DRAPE LEGGINS SURG 28X43 STRL (DRAPES) ×3 IMPLANT
GAUZE SPONGE 4X4 12PLY STRL (GAUZE/BANDAGES/DRESSINGS) ×3 IMPLANT
GLOVE BIOGEL PI IND STRL 8 (GLOVE) ×1 IMPLANT
GLOVE BIOGEL PI INDICATOR 8 (GLOVE) ×2
GLOVE SURG SYN 7.5  E (GLOVE) ×2
GLOVE SURG SYN 7.5 E (GLOVE) ×1 IMPLANT
GOWN STRL REUS W/ TWL LRG LVL3 (GOWN DISPOSABLE) ×1 IMPLANT
GOWN STRL REUS W/ TWL XL LVL3 (GOWN DISPOSABLE) ×1 IMPLANT
GOWN STRL REUS W/TWL LRG LVL3 (GOWN DISPOSABLE) ×2
GOWN STRL REUS W/TWL XL LVL3 (GOWN DISPOSABLE) ×2
IV LACTATED RINGER IRRG 3000ML (IV SOLUTION) ×22
IV LR IRRIG 3000ML ARTHROMATIC (IV SOLUTION) ×11 IMPLANT
KIT RM TURNOVER STRD PROC AR (KITS) ×3 IMPLANT
MANIFOLD NEPTUNE II (INSTRUMENTS) ×3 IMPLANT
MAT BLUE FLOOR 46X72 FLO (MISCELLANEOUS) ×3 IMPLANT
NEEDLE HYPO 22GX1.5 SAFETY (NEEDLE) ×3 IMPLANT
PACK ARTHROSCOPY KNEE (MISCELLANEOUS) ×3 IMPLANT
PAD ABD DERMACEA PRESS 5X9 (GAUZE/BANDAGES/DRESSINGS) ×6 IMPLANT
PAD WRAPON POLAR KNEE (MISCELLANEOUS) ×1 IMPLANT
PADDING CAST 6X4YD NS (MISCELLANEOUS) ×2
PADDING CAST COTTON 6X4 NS (MISCELLANEOUS) ×1 IMPLANT
PENCIL ELECTRO HAND CTR (MISCELLANEOUS) ×3 IMPLANT
SET TUBE SUCT SHAVER OUTFL 24K (TUBING) ×3 IMPLANT
SET TUBE TIP INTRA-ARTICULAR (MISCELLANEOUS) ×3 IMPLANT
STRIP CLOSURE SKIN 1/2X4 (GAUZE/BANDAGES/DRESSINGS) IMPLANT
SUT ETHILON 3-0 FS-10 30 BLK (SUTURE) ×3
SUTURE EHLN 3-0 FS-10 30 BLK (SUTURE) ×1 IMPLANT
TAPE TRANSPORE STRL 2 31045 (GAUZE/BANDAGES/DRESSINGS) ×3 IMPLANT
TOWEL OR 17X26 4PK STRL BLUE (TOWEL DISPOSABLE) ×6 IMPLANT
TUBING ARTHRO INFLOW-ONLY STRL (TUBING) ×3 IMPLANT
WAND HAND CNTRL MULTIVAC 50 (MISCELLANEOUS) IMPLANT
WAND HAND CNTRL MULTIVAC 90 (MISCELLANEOUS) ×3 IMPLANT
WRAPON POLAR PAD KNEE (MISCELLANEOUS) ×3

## 2017-11-19 NOTE — Op Note (Signed)
Operative Note   SURGERY DATE: 11/19/2017  PRE-OP DIAGNOSIS:  1. Right lateral meniscus tear  POST-OP DIAGNOSIS: 1. Right lateral meniscus tear 2. 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 Right knee arthroscopy, lateral meniscus repair and partial lateral meniscectomy 2. Chondroplasty of lateral and patellofemoral compartments  SURGEON: Cato Mulligan, MD  ANESTHESIA: Gen  ESTIMATED BLOOD LOSS:minimal  TOTAL IV FLUIDS: per anesthesia  INDICATION(S):  Debra Swanson is a 42 y.o. female [place]. Her pain improved over time until another episode where she was getting up off of a couch and felt another pop and knee pain and swelling. An MRI showed a bucket handle lateral meniscus tear. Her knee range of motion was limited preoperatively to 10-80. After discussion of risks, benefits, and alternatives to surgery, the patient elected to proceed.    OPERATIVE FINDINGS:   Examination under anesthesia:A careful examination under anesthesia was performed. Passive range of motion was: Hyperextension: 1. Extension: 0. Flexion: 110. Lachman: normal. Pivot Shift: normal. Posterior drawer: normal. Varus stability in full extension: normal. Varus stability in 30 degrees of flexion: normal. Valgus stability in full extension: normal. Valgus stability in 30 degrees of flexion: normal.  Intra-operative findings:A thorough arthroscopic examination of the knee was performed. The findings are: 1. Suprapatellar pouch: Normal 2. Undersurface of median ridge: Normal 3. Medial patellar facet: Grade 1-2 changes 4. Lateral patellar facet: Grade 1 changes 5. Trochlea: Area of grade 3 changes with apparent filling with fibrocartilage 6. Lateral gutter/popliteus tendon: Normal 7. Hoffa's fat pad: Normal 8. Medial gutter/plica: Normal 9. ACL: Normal 10. PCL: Normal 11. Medial  meniscus: Normal 12. Medial compartment cartilage: Normal on MFC, Grade 1 changes on tibial plateau with softening of cartilage 13. Lateral meniscus: Bucket handle tear with flipped fragment anteriorly. Tear extending from mid-body to posterior horn just lateral to root. Also small tear of posterior horn white-white zone extending ~30% meniscus depth. 14. Lateral compartment cartilage: Grade 1 changes to LFC, Grade 2 changes to tibial plateau  OPERATIVE REPORT:   I identified the patient in the pre-operative holding area. I marked theoperativeknee with my initials. I reviewed the risks and benefits of the proposed surgical intervention, and the patient (and/or patient's guardian) wished to proceed. The patient was transferred to the operative suite and placed in the supine position with all bony prominences padded. Anesthesia was administered. Appropriate IV antibioticswere administered within 30 minutes of incision. The extremity was then prepped and draped in standard fashion. A time out was performed confirming the correct extremity, correct patient, and correct procedure.  Arthroscopy portals were marked. Local anesthetic was injected to the planned portal sites. The anterolateral portalwasestablished with an 11blade. The arthroscope was placed in the anterolateral portal and theninto the suprapatellar pouch. A diagnostic knee scope was completed with the above findings. The lateral meniscus tear was identified.  Next the medial portal was established under needle localization. The meniscal tear was reduced with a blunt obturator. A meniscal rasp was used to rasp the meniscus tear as well as the surrounding capsule. A shaver was used to irritate the posterior capsule as well.  Stryker IvyAir all-inside suture devices were used to repair the lateral meniscus tear. We used a total of 7 stitches: 5 femoral and 2 tibial sided. 4 femoral and 1 tibial sided was placed medial/posterior to the  popliteus, and 1 femoral and 1 tibial stitch was placed lateral/anterior to the popliteus. We  made an accessory far medial portal under needle localization to place the 2 stitches lateral/anterior to the popliteus. Additionally, a small posterior horn white-white zone tear was debrided with a combination of meniscus biter and shaver until smooth and stable edges were achieved. The meniscus was probed and confirmed to be stable with restoration of the native anatomy.  Additionally, the knee was taken through a range of motion multiple times and stability was confirmed. Arthroscopic fluid was removed from the joint.  The portals were closed with 3-0 Nylon suture. Local anesthetic was injected into the knee joint. Sterile dressings included Xeroform, 4x4s, Sof-Rol, and Bias wrap. A Polarcare was placed.  Hinged knee brace was applied with knee locked in extension.The patient was then awakened and taken to the PACU hemodynamically stable without complication.   POSTOPERATIVE PLAN: The patient will be discharged home today once PACU criteria has been met. Aspirin 325 mg daily was prescribed for 4 weeks for DVT prophylaxis. Physical therapy will start on POD#3-4. NWB x 6 weeks. Follow up in 2 weeks per protocol.

## 2017-11-19 NOTE — Anesthesia Post-op Follow-up Note (Signed)
Anesthesia QCDR form completed.        

## 2017-11-19 NOTE — H&P (Signed)
Paper H&P to be scanned into permanent record. H&P reviewed. No significant changes noted.  

## 2017-11-19 NOTE — Anesthesia Preprocedure Evaluation (Addendum)
Anesthesia Evaluation  Patient identified by MRN, date of birth, ID band Patient awake    Reviewed: Allergy & Precautions, NPO status , Patient's Chart, lab work & pertinent test results  Airway Mallampati: II       Dental  (+) Teeth Intact   Pulmonary neg pulmonary ROS,    Pulmonary exam normal        Cardiovascular hypertension, Normal cardiovascular exam     Neuro/Psych negative neurological ROS  negative psych ROS   GI/Hepatic Neg liver ROS, GERD  ,  Endo/Other  diabetesMorbid obesity  Renal/GU      Musculoskeletal   Abdominal Normal abdominal exam  (+)   Peds  Hematology  (+) anemia ,   Anesthesia Other Findings   Reproductive/Obstetrics                            Anesthesia Physical Anesthesia Plan  ASA: III  Anesthesia Plan:    Post-op Pain Management:    Induction: Intravenous, Rapid sequence and Cricoid pressure planned  PONV Risk Score and Plan:   Airway Management Planned: Oral ETT  Additional Equipment:   Intra-op Plan:   Post-operative Plan: Extubation in OR  Informed Consent: I have reviewed the patients History and Physical, chart, labs and discussed the procedure including the risks, benefits and alternatives for the proposed anesthesia with the patient or authorized representative who has indicated his/her understanding and acceptance.   Dental advisory given  Plan Discussed with: CRNA and Surgeon  Anesthesia Plan Comments:         Anesthesia Quick Evaluation

## 2017-11-19 NOTE — Anesthesia Postprocedure Evaluation (Signed)
Anesthesia Post Note  Patient: Debra Swanson  Procedure(s) Performed: KNEE ARTHROSCOPY WITH MENISCAL REPAIR,MEDIAL MENISCECTOMY,CHONDROPLASTY (Right Knee)  Patient location during evaluation: PACU Anesthesia Type: General Level of consciousness: awake and alert and oriented Pain management: pain level controlled Vital Signs Assessment: post-procedure vital signs reviewed and stable Respiratory status: spontaneous breathing Cardiovascular status: blood pressure returned to baseline Anesthetic complications: no     Last Vitals:  Vitals:   11/19/17 1112 11/19/17 1122  BP:  (!) 93/59  Pulse: 68 74  Resp: 13 18  Temp: 36.6 C (!) 36.2 C  SpO2: 100% 100%    Last Pain:  Vitals:   11/19/17 1222  TempSrc:   PainSc: 2                  Vernon Ariel

## 2017-11-19 NOTE — Anesthesia Procedure Notes (Signed)
Procedure Name: Intubation Date/Time: 11/19/2017 7:41 AM Performed by: Allean Found, CRNA Pre-anesthesia Checklist: Patient identified, Emergency Drugs available, Suction available, Patient being monitored and Timeout performed Patient Re-evaluated:Patient Re-evaluated prior to induction Oxygen Delivery Method: Circle system utilized Induction Type: IV induction Ventilation: Mask ventilation without difficulty Laryngoscope Size: Mac and 3 Grade View: Grade II Tube type: Oral Tube size: 7.0 mm Number of attempts: 1 Placement Confirmation: ETT inserted through vocal cords under direct vision,  positive ETCO2 and breath sounds checked- equal and bilateral Secured at: 21 cm Tube secured with: Tape Dental Injury: Teeth and Oropharynx as per pre-operative assessment

## 2017-11-19 NOTE — Discharge Instructions (Signed)
AMBULATORY SURGERY  DISCHARGE INSTRUCTIONS   1) The drugs that you were given will stay in your system until tomorrow so for the next 24 hours you should not:  A) Drive an automobile B) Make any legal decisions C) Drink any alcoholic beverage   2) You may resume regular meals tomorrow.  Today it is better to start with liquids and gradually work up to solid foods.  You may eat anything you prefer, but it is better to start with liquids, then soup and crackers, and gradually work up to solid foods.   3) Please notify your doctor immediately if you have any unusual bleeding, trouble breathing, redness and pain at the surgery site, drainage, fever, or pain not relieved by medication.    4) Additional Instructions:        Please contact your physician with any problems or Same Day Surgery at [phone], Monday through Friday 6 am to 4 pm, or [place] at [facility] number at [phone].Arthroscopic Knee Surgery - Meniscus Repair  Post-Op Instructions  1. Bracing or crutches: Crutches will be provided at the time of discharge from the surgery center.   2. Ice: You may be provided with a device [facility]) that allows you to ice the affected area effectively. Otherwise you can ice manually.   3. Driving:  Plan on not driving for at least four weeks. Please note that you are advised NOT to drive while taking narcotic pain medications as you may be impaired and unsafe to drive.  4. Activity: Ankle pumps several times an hour while awake to prevent blood clots. Weight bearing: NON-WEIGHT BEARING x 6 weeks. Do not flex your knee past 90 degrees until cleared by your therapist. Elevate knee above heart level as much as possible for one week. Avoid standing more than 5 minutes (consecutively) for the first week. No exercise involving the knee until cleared by the surgeon or physical therapist.  Avoid long distance travel for 4 weeks.  5. Medications:  - You have been  provided a prescription for narcotic pain medicine. After surgery, take 1-2 narcotic tablets every 4 hours if needed for severe pain.  - A prescription for anti-nausea medication will be provided in case the narcotic medicine causes nausea - take 1 tablet every 6 hours only if nauseated.  - Take enteric coated aspirin 325 mg once daily for 4 weeks to prevent blood clots.  -Take tylenol 1000 every 8 hours for pain.  May stop tylenol 3 days after surgery or when you are having minimal pain.  If you are taking prescription medication for anxiety, depression, insomnia, muscle spasm, chronic pain, or for attention deficit disorder you are advised that you are at a higher risk of adverse effects with use of narcotics post-op, including narcotic addiction/dependence, depressed breathing, death. If you use non-prescribed substances: alcohol, marijuana, cocaine, heroin, methamphetamines, etc., you are at a higher risk of adverse effects with use of narcotics post-op, including narcotic addiction/dependence, depressed breathing, death. You are advised that taking > 50 morphine milligram equivalents (MME) of narcotic pain medication per day results in twice the risk of overdose or death. For your prescription provided: oxycodone 5 mg - taking more than 6 tablets per day. Be advised that we will prescribe narcotics short-term, for acute post-operative pain only - 1 week for minor operations such as knee arthroscopy for meniscus tear resection, and 3 weeks for major operations such as knee repair/reconstruction surgeries.   6. Bandages: The physical therapist should change  the bandages at the first post-op appointment. If needed, the dressing supplies have been provided to you.  7. Physical Therapy: 2 times per week for the first 4 weeks, then 1-2 times per week from weeks 4-8 post-op. Therapy typically starts on post operative Day 3 or 4. You have been provided an order for physical therapy. The therapist will  provide home exercises.  8. Work: May return to full work when off of crutches. May do light duty/desk job in approximately 1-2 weeks when off of narcotics, pain is well-controlled, and swelling has decreased.  9. Post-Op Appointments: Your first post-op appointment will be with Dr. Posey Pronto in approximately 2 weeks time.   If you find that they have not been scheduled please call the Orthopaedic Appointment front desk at [phone].

## 2017-11-19 NOTE — Transfer of Care (Signed)
Immediate Anesthesia Transfer of Care Note  Patient: Debra Swanson  Procedure(s) Performed: KNEE ARTHROSCOPY WITH MENISCAL REPAIR,MEDIAL MENISCECTOMY,CHONDROPLASTY (Right Knee)  Patient Location: PACU  Anesthesia Type:General  Level of Consciousness: awake  Airway & Oxygen Therapy: Patient Spontanous Breathing and Patient connected to face mask oxygen  Post-op Assessment: Report given to RN and Post -op Vital signs reviewed and stable  Post vital signs: Reviewed and stable  Last Vitals:  Vitals:   11/19/17 1016 11/19/17 1023  BP: (!) 86/56 118/65  Pulse: 84 81  Resp: 17 15  Temp: (!) 36.4 C   SpO2: 100% 100%    Last Pain:  Vitals:   11/19/17 1016  TempSrc:   PainSc: Asleep         Complications: No apparent anesthesia complications

## 2017-11-22 ENCOUNTER — Encounter: Payer: Self-pay | Admitting: Orthopedic Surgery

## 2018-10-12 NOTE — H&P (Signed)
Debra Swanson is a 42 y.o. female here for [facility] bilateral salpingectomy Pt with a 2-3 yr h/o heavy monthly menses .Bleeds for 5 days ++ clots and pain . Pt with anemia . No dyspareunia . 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  Past Surgical History:  has a past surgical history that includes Roux-en-y procedure (07/2009); Cholecystectomy; Essure tubal ligation (2013); egd (09/17/2016); Bariatric Surgery; pr removal gallbladder; Fracture surgery (January 2019); Essure tubal ligation; Tubal ligation (2008); and egd (N/A, 09/19/2018). Family History: family history includes Coronary Artery Disease (Blocked arteries around heart) in her father and mother; Diabetes type II in her father; High blood pressure (Hypertension) in her father and mother; Hyperlipidemia (Elevated cholesterol) in her mother. Social History:  reports that she has never smoked. She has never used smokeless tobacco. She reports current alcohol use. She reports that she does not use drugs. OB/GYN History:          OB History    Gravida  2   Para  1   Term  1   Preterm      AB  1   Living  1     SAB      TAB  1   Ectopic      Molar      Multiple      Live Births  1          Allergies: has No Known Allergies. Medications:  Current Outpatient Medications:  .  acetaminophen (TYLENOL) 500 MG tablet, Take by mouth, Disp: , Rfl:  .  lorcaserin 10 mg Tab, Take 10 mg by mouth 2 (two) times daily, Disp: 60 tablet, Rfl: 1 .   metFORMIN (GLUCOPHAGE) 500 MG tablet, Take 1 tablet (500 mg total) by mouth daily with breakfast, Disp: 30 tablet, Rfl: 5 .  multivitamin tablet, Take 1 tablet by mouth once daily., Disp: , Rfl:  .  pen needle, diabetic (BD ULTRA-FINE NANO PEN NEEDLES) 32 gauge x 5/32" Ndle, Use 1 each once daily., Disp: 90 each, Rfl: 2  Review of Systems: General:                      No fatigue or weight loss Eyes:                           No vision changes Ears:                            No hearing difficulty Respiratory:                No cough or shortness of breath Pulmonary:                  No asthma or shortness of breath Cardiovascular:           No chest pain, palpitations, dyspnea on exertion Gastrointestinal:          No abdominal bloating, chronic diarrhea, constipations, masses, pain or hematochezia Genitourinary:  No hematuria, dysuria, abnormal vaginal discharge, pelvic pain,+ Menometrorrhagia Lymphatic:                   No swollen lymph nodes Musculoskeletal:         No muscle weakness Neurologic:                  No extremity weakness, syncope, seizure disorder Psychiatric:                  No history of depression, delusions or suicidal/homicidal ideation    Exam:      Vitals:   10/12/18  BP: 105/70  Pulse: 69    Body mass index is 53.88 kg/m.  WDWN / black female in NAD   Lungs: CTA  CV : RRR without murmur    Neck:  no thyromegaly Abdomen: soft , no mass, normal active bowel sounds,  non-tender, no rebound tenderness Pelvic: tanner stage 5 ,  External genitalia: vulva /labia no lesions Urethra: no prolapse Vagina: normal physiologic d/c Cervix: no lesions, no cervical motion tenderness , deviated to right   Uterus:  Retroverted normal size shape and contour, non-tender Adnexa: no mass,  non-tender     Impression:   The primary encounter diagnosis was Menorrhagia with regular cycle. A diagnosis of Dysmenorrhea was also pertinent to this  visit.    Plan:   I have spoken with the patient regarding treatment options including expectant management, hormonal options, or surgical intervention. After a full discussion the pt elects to proceed with [facility] and bilateral salpingectomy  I have explained the role of morcellation and the risk of upstaging an occult uterine cancer . She is aware of the risk and wishes to proceed      Return if symptoms worsen or fail to improve.  Caroline Sauger, MD

## 2018-10-17 ENCOUNTER — Encounter
Admission: RE | Admit: 2018-10-17 | Discharge: 2018-10-17 | Disposition: A | Discharge status: Home / Self Care | Payer: BC Managed Care – PPO | Source: Ambulatory Visit | Attending: Obstetrics and Gynecology | Admitting: Obstetrics and Gynecology

## 2018-10-17 ENCOUNTER — Other Ambulatory Visit: Payer: Self-pay

## 2018-10-17 NOTE — Patient Instructions (Addendum)
Your procedure is scheduled on: 10-28-18 FRIDAY Report to Same Day Surgery 2nd floor medical mall [facility] Entrance-take elevator on left to 2nd floor.  Check in with surgery information desk.) To find out your arrival time please call [phone] between 1PM - 3PM on 10-27-18 THURSDAY  Remember: Instructions that are not followed completely may result in serious medical risk, up to and including death, or upon the discretion of your surgeon and anesthesiologist your surgery may need to be rescheduled.    _x___ 1. Do not eat food after midnight the night before your procedure. You may drink WATER up to 2 hours before you are scheduled to arrive at the hospital for your procedure.  Do not drink WATER within 2 hours of your scheduled arrival to the hospital.  Type 1 and type 2 diabetics should only drink water.   ____Ensure clear carbohydrate drink on the way to the hospital for bariatric patients  ____Ensure clear carbohydrate drink 3 hours before surgery for Dr Dwyane Luo patients if physician instructed.     __x__ 2. No Alcohol for 24 hours before or after surgery.   __x__3. No Smoking or e-cigarettes for 24 prior to surgery.  Do not use any chewable tobacco products for at least 6 hour prior to surgery   ____  4. Bring all medications with you on the day of surgery if instructed.    __x__ 5. Notify your doctor if there is any change in your medical condition     (cold, fever, infections).    x___6. On the morning of surgery brush your teeth with toothpaste and water.  You may rinse your mouth with mouth wash if you wish.  Do not swallow any toothpaste or mouthwash.   Do not wear jewelry, make-up, hairpins, clips or nail polish.  Do not wear lotions, powders, or perfumes. You may wear deodorant.  Do not shave 48 hours prior to surgery. Men may shave face and neck.  Do not bring valuables to the hospital.    [facility] is not responsible for any belongings or valuables.             Contacts, dentures or bridgework may not be worn into surgery.  Leave your suitcase in the car. After surgery it may be brought to your room.  For patients admitted to the hospital, discharge time is determined by your treatment team.  _  Patients discharged the day of surgery will not be allowed to drive home.  You will need someone to drive you home and stay with you the night of your procedure.    Please read over the following fact sheets that you were given:   [facility] Preparing for Surgery    ____ Take anti-hypertensive listed below, cardiac, seizure, asthma, anti-reflux and psychiatric medicines. These include:  1. NONE  2.  3.  4.  5.  6.  _X___Fleets enema as directed-DO FLEET ENEMA AT HOME THE MORNING OF SURGERY 1 HOUR PRIOR TO ARRIVAL TIME TO HOSPITAL   _x___ Use CHG Soap or sage wipes as directed on instruction sheet   ____ Use inhalers on the day of surgery and bring to hospital day of surgery  _X___ Stop Metformin 2 days prior to surgery-LAST DOSE ON Tuesday, December 17TH    ____ Take 1/2 of usual insulin dose the night before surgery and none on the morning surgery.   ____ Follow recommendations from Cardiologist, Pulmonologist or PCP regarding stopping Aspirin, Coumadin, Plavix ,Eliquis, Effient, or Pradaxa,  and Pletal.  X____Stop Anti-inflammatories such as Advil, Aleve, Ibuprofen, Motrin, Naproxen, Naprosyn, Goodies powders or aspirin products 7 DAYS PRIOR TO SURGERY-OK to take Tylenol .   _x___ Stop supplements until after surgery-STOP VITAMIN C 7 DAYS PRIOR TO SURGERY   ____ Bring C-Pap to the hospital.

## 2018-10-18 ENCOUNTER — Encounter
Admission: RE | Admit: 2018-10-18 | Discharge: 2018-10-18 | Disposition: A | Discharge status: Home / Self Care | Payer: BC Managed Care – PPO | Source: Ambulatory Visit | Attending: Obstetrics and Gynecology | Admitting: Obstetrics and Gynecology

## 2018-10-18 DIAGNOSIS — Z01818 Encounter for other preprocedural examination: Secondary | ICD-10-CM | POA: Insufficient documentation

## 2018-10-18 LAB — CBC
HCT: 31.7 % — ABNORMAL LOW (ref 36.0–46.0)
Hemoglobin: 9.2 g/dL — ABNORMAL LOW (ref 12.0–15.0)
MCH: 20.5 pg — ABNORMAL LOW (ref 26.0–34.0)
MCHC: 29 g/dL — ABNORMAL LOW (ref 30.0–36.0)
MCV: 70.8 fL — ABNORMAL LOW (ref 80.0–100.0)
Platelets: 562 10*3/uL — ABNORMAL HIGH (ref 150–400)
RBC: 4.48 MIL/uL (ref 3.87–5.11)
RDW: 24.5 % — ABNORMAL HIGH (ref 11.5–15.5)
WBC: 9.1 10*3/uL (ref 4.0–10.5)
nRBC: 0 % (ref 0.0–0.2)

## 2018-10-18 LAB — BASIC METABOLIC PANEL
Anion gap: 7 (ref 5–15)
BUN: 16 mg/dL (ref 6–20)
CO2: 25 mmol/L (ref 22–32)
Calcium: 8.6 mg/dL — ABNORMAL LOW (ref 8.9–10.3)
Chloride: 106 mmol/L (ref 98–111)
Creatinine, Ser: 0.86 mg/dL (ref 0.44–1.00)
GFR calc Af Amer: >60 mL/min (ref >60)
GFR calc non Af Amer: >60 mL/min (ref >60)
Glucose, Bld: 99 mg/dL (ref 70–99)
Potassium: 4.2 mmol/L (ref 3.5–5.1)
Sodium: 138 mmol/L (ref 135–145)

## 2018-10-18 LAB — TYPE AND SCREEN
ABO/RH(D): A POS
Antibody Screen: NEGATIVE

## 2018-10-18 MED ORDER — FLEET ENEMA 7-19 GM/118ML RE ENEM
1.0000 | ENEMA | Freq: Once | RECTAL | Status: DC
  Filled 2018-10-18: qty 1

## 2018-10-26 MED ORDER — PROPOFOL 500 MG/50ML IV EMUL
INTRAVENOUS | Status: AC
  Filled 2018-10-26: qty 50

## 2018-10-26 MED ORDER — LIDOCAINE HCL (PF) 2 % IJ SOLN
INTRAMUSCULAR | Status: AC
  Filled 2018-10-26: qty 10

## 2018-10-28 ENCOUNTER — Encounter
Admission: RE | Disposition: A | Discharge status: Home / Self Care | Payer: Self-pay | Source: Ambulatory Visit | Attending: Obstetrics and Gynecology

## 2018-10-28 ENCOUNTER — Ambulatory Visit: Payer: BC Managed Care – PPO | Admitting: Certified Registered"

## 2018-10-28 ENCOUNTER — Observation Stay
Admission: RE | Admit: 2018-10-28 | Discharge: 2018-10-29 | Disposition: A | Discharge status: Home / Self Care | Payer: BC Managed Care – PPO | Source: Ambulatory Visit | Attending: Obstetrics and Gynecology | Admitting: Obstetrics and Gynecology

## 2018-10-28 ENCOUNTER — Other Ambulatory Visit: Payer: Self-pay

## 2018-10-28 ENCOUNTER — Encounter: Payer: Self-pay | Admitting: Emergency Medicine

## 2018-10-28 DIAGNOSIS — N946 Dysmenorrhea, unspecified: Secondary | ICD-10-CM | POA: Insufficient documentation

## 2018-10-28 DIAGNOSIS — Z79899 Other long term (current) drug therapy: Secondary | ICD-10-CM | POA: Insufficient documentation

## 2018-10-28 DIAGNOSIS — F329 Major depressive disorder, single episode, unspecified: Secondary | ICD-10-CM | POA: Insufficient documentation

## 2018-10-28 DIAGNOSIS — M1990 Unspecified osteoarthritis, unspecified site: Secondary | ICD-10-CM | POA: Insufficient documentation

## 2018-10-28 DIAGNOSIS — Z7984 Long term (current) use of oral hypoglycemic drugs: Secondary | ICD-10-CM | POA: Insufficient documentation

## 2018-10-28 DIAGNOSIS — K219 Gastro-esophageal reflux disease without esophagitis: Secondary | ICD-10-CM | POA: Diagnosis not present

## 2018-10-28 DIAGNOSIS — Z9889 Other specified postprocedural states: Secondary | ICD-10-CM

## 2018-10-28 DIAGNOSIS — E119 Type 2 diabetes mellitus without complications: Secondary | ICD-10-CM | POA: Diagnosis not present

## 2018-10-28 DIAGNOSIS — D649 Anemia, unspecified: Secondary | ICD-10-CM | POA: Diagnosis not present

## 2018-10-28 DIAGNOSIS — Z833 Family history of diabetes mellitus: Secondary | ICD-10-CM | POA: Diagnosis not present

## 2018-10-28 DIAGNOSIS — D259 Leiomyoma of uterus, unspecified: Secondary | ICD-10-CM | POA: Insufficient documentation

## 2018-10-28 DIAGNOSIS — N72 Inflammatory disease of cervix uteri: Secondary | ICD-10-CM | POA: Insufficient documentation

## 2018-10-28 DIAGNOSIS — Z791 Long term (current) use of non-steroidal anti-inflammatories (NSAID): Secondary | ICD-10-CM | POA: Diagnosis not present

## 2018-10-28 DIAGNOSIS — N800 Endometriosis of uterus: Principal | ICD-10-CM | POA: Insufficient documentation

## 2018-10-28 DIAGNOSIS — E669 Obesity, unspecified: Secondary | ICD-10-CM | POA: Diagnosis not present

## 2018-10-28 DIAGNOSIS — Z6841 Body Mass Index (BMI) 40.0 and over, adult: Secondary | ICD-10-CM | POA: Diagnosis not present

## 2018-10-28 DIAGNOSIS — Z8249 Family history of ischemic heart disease and other diseases of the circulatory system: Secondary | ICD-10-CM | POA: Insufficient documentation

## 2018-10-28 DIAGNOSIS — F419 Anxiety disorder, unspecified: Secondary | ICD-10-CM | POA: Diagnosis not present

## 2018-10-28 DIAGNOSIS — I10 Essential (primary) hypertension: Secondary | ICD-10-CM | POA: Diagnosis not present

## 2018-10-28 DIAGNOSIS — N920 Excessive and frequent menstruation with regular cycle: Secondary | ICD-10-CM | POA: Diagnosis present

## 2018-10-28 HISTORY — PX: LAPAROSCOPIC SUPRACERVICAL HYSTERECTOMY: SHX5399

## 2018-10-28 HISTORY — PX: LAPAROSCOPIC BILATERAL SALPINGECTOMY: SHX5889

## 2018-10-28 LAB — CBC
HCT: 33.1 % — ABNORMAL LOW (ref 36.0–46.0)
Hemoglobin: 9.5 g/dL — ABNORMAL LOW (ref 12.0–15.0)
MCH: 20.5 pg — AB (ref 26.0–34.0)
MCHC: 28.7 g/dL — AB (ref 30.0–36.0)
MCV: 71.5 fL — ABNORMAL LOW (ref 80.0–100.0)
Platelets: 457 10*3/uL — ABNORMAL HIGH (ref 150–400)
RBC: 4.63 MIL/uL (ref 3.87–5.11)
RDW: 24.1 % — ABNORMAL HIGH (ref 11.5–15.5)
WBC: 15.7 10*3/uL — ABNORMAL HIGH (ref 4.0–10.5)
nRBC: 0 % (ref 0.0–0.2)

## 2018-10-28 LAB — ABO/RH: ABO/RH(D): A POS

## 2018-10-28 LAB — GLUCOSE, CAPILLARY: Glucose-Capillary: 95 mg/dL (ref 70–99)

## 2018-10-28 LAB — POCT PREGNANCY, URINE: Preg Test, Ur: NEGATIVE

## 2018-10-28 SURGERY — HYSTERECTOMY, SUPRACERVICAL, LAPAROSCOPIC
Anesthesia: General

## 2018-10-28 MED ORDER — LIDOCAINE HCL (PF) 2 % IJ SOLN
INTRAMUSCULAR | Status: AC
  Filled 2018-10-28: qty 10

## 2018-10-28 MED ORDER — FENTANYL CITRATE (PF) 250 MCG/5ML IJ SOLN
INTRAMUSCULAR | Status: AC
  Filled 2018-10-28: qty 5

## 2018-10-28 MED ORDER — MIDAZOLAM HCL 2 MG/2ML IJ SOLN
INTRAMUSCULAR | Status: AC
  Filled 2018-10-28: qty 2

## 2018-10-28 MED ORDER — LACTATED RINGERS IV SOLN
INTRAVENOUS | Status: DC | PRN
  Administered 2018-10-28: 13:00:00 via INTRAVENOUS

## 2018-10-28 MED ORDER — DEXAMETHASONE SODIUM PHOSPHATE 10 MG/ML IJ SOLN
INTRAMUSCULAR | Status: DC | PRN
  Administered 2018-10-28: 4 mg via INTRAVENOUS

## 2018-10-28 MED ORDER — ONDANSETRON HCL 4 MG/2ML IJ SOLN
INTRAMUSCULAR | Status: AC
  Filled 2018-10-28: qty 2

## 2018-10-28 MED ORDER — ACETAMINOPHEN 10 MG/ML IV SOLN
INTRAVENOUS | Status: AC
  Filled 2018-10-28: qty 100

## 2018-10-28 MED ORDER — BUPIVACAINE HCL 0.5 % IJ SOLN
INTRAMUSCULAR | Status: DC | PRN
  Administered 2018-10-28: 16 mL

## 2018-10-28 MED ORDER — PHENYLEPHRINE HCL 10 MG/ML IJ SOLN
INTRAMUSCULAR | Status: DC | PRN
  Administered 2018-10-28: 100 ug via INTRAVENOUS

## 2018-10-28 MED ORDER — BUPIVACAINE HCL (PF) 0.5 % IJ SOLN
INTRAMUSCULAR | Status: AC
  Filled 2018-10-28: qty 30

## 2018-10-28 MED ORDER — SUGAMMADEX SODIUM 200 MG/2ML IV SOLN
INTRAVENOUS | Status: DC | PRN
  Administered 2018-10-28: 300 mg via INTRAVENOUS

## 2018-10-28 MED ORDER — LIDOCAINE HCL (CARDIAC) PF 100 MG/5ML IV SOSY
PREFILLED_SYRINGE | INTRAVENOUS | Status: DC | PRN
  Administered 2018-10-28: 100 mg via INTRAVENOUS

## 2018-10-28 MED ORDER — OXYCODONE HCL 5 MG PO TABS: 5.0000 mg | ORAL_TABLET | Freq: Once | ORAL | Status: DC | PRN

## 2018-10-28 MED ORDER — ROCURONIUM BROMIDE 50 MG/5ML IV SOLN
INTRAVENOUS | Status: AC
  Filled 2018-10-28: qty 1

## 2018-10-28 MED ORDER — DEXAMETHASONE SODIUM PHOSPHATE 10 MG/ML IJ SOLN
INTRAMUSCULAR | Status: AC
  Filled 2018-10-28: qty 1

## 2018-10-28 MED ORDER — FENTANYL CITRATE (PF) 100 MCG/2ML IJ SOLN
INTRAMUSCULAR | Status: DC | PRN
  Administered 2018-10-28 (×2): 50 ug via INTRAVENOUS
  Administered 2018-10-28: 150 ug via INTRAVENOUS

## 2018-10-28 MED ORDER — FENTANYL CITRATE (PF) 100 MCG/2ML IJ SOLN
INTRAMUSCULAR | Status: AC
  Filled 2018-10-28: qty 2

## 2018-10-28 MED ORDER — MORPHINE SULFATE (PF) 2 MG/ML IV SOLN
1.0000 mg | INTRAVENOUS | Status: DC | PRN
  Administered 2018-10-28: 1 mg via INTRAVENOUS
  Filled 2018-10-28: qty 1

## 2018-10-28 MED ORDER — KETOROLAC TROMETHAMINE 30 MG/ML IJ SOLN: 30.0000 mg | Freq: Three times a day (TID) | INTRAMUSCULAR | Status: DC | PRN

## 2018-10-28 MED ORDER — GABAPENTIN 300 MG PO CAPS
300.0000 mg | ORAL_CAPSULE | Freq: Once | ORAL | Status: AC
  Administered 2018-10-28: 300 mg via ORAL
  Filled 2018-10-28: qty 1

## 2018-10-28 MED ORDER — DEXTROSE 5 % IV SOLN
3.0000 g | INTRAVENOUS | Status: AC
  Administered 2018-10-28: 3 g via INTRAVENOUS
  Filled 2018-10-28: qty 3

## 2018-10-28 MED ORDER — LACTATED RINGERS IV SOLN
INTRAVENOUS | Status: DC
  Administered 2018-10-28: 17:00:00 via INTRAVENOUS

## 2018-10-28 MED ORDER — SUGAMMADEX SODIUM 500 MG/5ML IV SOLN
INTRAVENOUS | Status: AC
  Filled 2018-10-28: qty 5

## 2018-10-28 MED ORDER — PROPOFOL 10 MG/ML IV BOLUS
INTRAVENOUS | Status: AC
  Filled 2018-10-28: qty 20

## 2018-10-28 MED ORDER — OXYCODONE HCL 5 MG/5ML PO SOLN: 5.0000 mg | Freq: Once | ORAL | Status: DC | PRN

## 2018-10-28 MED ORDER — ONDANSETRON HCL 4 MG PO TABS: 4.0000 mg | ORAL_TABLET | Freq: Four times a day (QID) | ORAL | Status: DC | PRN

## 2018-10-28 MED ORDER — MIDAZOLAM HCL 2 MG/2ML IJ SOLN
INTRAMUSCULAR | Status: DC | PRN
  Administered 2018-10-28 (×2): 2 mg via INTRAVENOUS

## 2018-10-28 MED ORDER — KETOROLAC TROMETHAMINE 30 MG/ML IJ SOLN
INTRAMUSCULAR | Status: DC | PRN
  Administered 2018-10-28: 30 mg via INTRAVENOUS

## 2018-10-28 MED ORDER — HYDROMORPHONE HCL 1 MG/ML IJ SOLN
INTRAMUSCULAR | Status: AC
  Filled 2018-10-28: qty 1

## 2018-10-28 MED ORDER — ACETAMINOPHEN 10 MG/ML IV SOLN
INTRAVENOUS | Status: DC | PRN
  Administered 2018-10-28: 1000 mg via INTRAVENOUS

## 2018-10-28 MED ORDER — PROMETHAZINE HCL 25 MG/ML IJ SOLN: 6.2500 mg | INTRAMUSCULAR | Status: DC | PRN

## 2018-10-28 MED ORDER — FENTANYL CITRATE (PF) 100 MCG/2ML IJ SOLN
25.0000 ug | INTRAMUSCULAR | Status: DC | PRN
  Administered 2018-10-28: 25 ug via INTRAVENOUS
  Administered 2018-10-28: 50 ug via INTRAVENOUS
  Administered 2018-10-28: 25 ug via INTRAVENOUS

## 2018-10-28 MED ORDER — PROPOFOL 10 MG/ML IV BOLUS
INTRAVENOUS | Status: DC | PRN
  Administered 2018-10-28: 200 mg via INTRAVENOUS
  Administered 2018-10-28: 50 mg via INTRAVENOUS

## 2018-10-28 MED ORDER — OXYCODONE HCL 5 MG PO TABS
5.0000 mg | ORAL_TABLET | ORAL | Status: DC | PRN
  Administered 2018-10-28 – 2018-10-29 (×4): 10 mg via ORAL
  Filled 2018-10-28 (×4): qty 2

## 2018-10-28 MED ORDER — MEPERIDINE HCL 50 MG/ML IJ SOLN: 6.2500 mg | INTRAMUSCULAR | Status: DC | PRN

## 2018-10-28 MED ORDER — GABAPENTIN 300 MG PO CAPS
300.0000 mg | ORAL_CAPSULE | Freq: Two times a day (BID) | ORAL | Status: DC
  Administered 2018-10-28 – 2018-10-29 (×2): 300 mg via ORAL
  Filled 2018-10-28 (×2): qty 1

## 2018-10-28 MED ORDER — ONDANSETRON HCL 4 MG/2ML IJ SOLN: 4.0000 mg | Freq: Four times a day (QID) | INTRAMUSCULAR | Status: DC | PRN

## 2018-10-28 MED ORDER — ONDANSETRON HCL 4 MG/2ML IJ SOLN
INTRAMUSCULAR | Status: DC | PRN
  Administered 2018-10-28: 4 mg via INTRAVENOUS

## 2018-10-28 MED ORDER — SILVER NITRATE-POT NITRATE 75-25 % EX MISC
CUTANEOUS | Status: AC
  Filled 2018-10-28: qty 1

## 2018-10-28 MED ORDER — SUCCINYLCHOLINE CHLORIDE 20 MG/ML IJ SOLN
INTRAMUSCULAR | Status: DC | PRN
  Administered 2018-10-28: 150 mg via INTRAVENOUS

## 2018-10-28 MED ORDER — HYDROMORPHONE HCL 1 MG/ML IJ SOLN
INTRAMUSCULAR | Status: DC | PRN
  Administered 2018-10-28 (×2): 0.5 mg via INTRAVENOUS

## 2018-10-28 MED ORDER — SODIUM CHLORIDE 0.9 % IV SOLN
INTRAVENOUS | Status: DC
  Administered 2018-10-28: 09:00:00 via INTRAVENOUS

## 2018-10-28 MED ORDER — ROCURONIUM BROMIDE 100 MG/10ML IV SOLN
INTRAVENOUS | Status: DC | PRN
  Administered 2018-10-28: 10 mg via INTRAVENOUS
  Administered 2018-10-28: 40 mg via INTRAVENOUS
  Administered 2018-10-28: 10 mg via INTRAVENOUS

## 2018-10-28 SURGICAL SUPPLY — 41 items
BAG URINE DRAINAGE (UROLOGICAL SUPPLIES) ×3 IMPLANT
BLADE SURG SZ11 CARB STEEL (BLADE) ×6 IMPLANT
CANISTER SUCT 1200ML W/VALVE (MISCELLANEOUS) ×3 IMPLANT
CATH FOLEY 2WAY  5CC 16FR (CATHETERS) ×1
CATH URTH 16FR FL 2W BLN LF (CATHETERS) ×2 IMPLANT
CHLORAPREP W/TINT 26ML (MISCELLANEOUS) ×3 IMPLANT
COVER WAND RF STERILE (DRAPES) ×3 IMPLANT
DRSG TEGADERM 2-3/8X2-3/4 SM (GAUZE/BANDAGES/DRESSINGS) ×9 IMPLANT
GLOVE BIO SURGEON STRL SZ8 (GLOVE) ×9 IMPLANT
GOWN STRL REUS W/ TWL LRG LVL3 (GOWN DISPOSABLE) ×4 IMPLANT
GOWN STRL REUS W/ TWL XL LVL3 (GOWN DISPOSABLE) ×2 IMPLANT
GOWN STRL REUS W/TWL LRG LVL3 (GOWN DISPOSABLE) ×2
GOWN STRL REUS W/TWL XL LVL3 (GOWN DISPOSABLE) ×1
GRASPER SUT TROCAR 14GX15 (MISCELLANEOUS) ×3 IMPLANT
IRRIGATION STRYKERFLOW (MISCELLANEOUS) ×2 IMPLANT
IRRIGATOR STRYKERFLOW (MISCELLANEOUS) ×3
IV LACTATED RINGERS 1000ML (IV SOLUTION) ×3 IMPLANT
KIT PINK PAD W/HEAD ARE REST (MISCELLANEOUS) ×3
KIT PINK PAD W/HEAD ARM REST (MISCELLANEOUS) ×2 IMPLANT
KIT TURNOVER CYSTO (KITS) ×3 IMPLANT
LABEL OR SOLS (LABEL) ×3 IMPLANT
MORCELLATOR XCISE  COR (MISCELLANEOUS) ×1
MORCELLATOR XCISE COR (MISCELLANEOUS) ×2 IMPLANT
NS IRRIG 500ML POUR BTL (IV SOLUTION) ×3 IMPLANT
PACK GYN LAPAROSCOPIC (MISCELLANEOUS) ×3 IMPLANT
PAD OB MATERNITY 4.3X12.25 (PERSONAL CARE ITEMS) ×3 IMPLANT
PAD PREP 24X41 OB/GYN DISP (PERSONAL CARE ITEMS) ×3 IMPLANT
SET CYSTO W/LG BORE CLAMP LF (SET/KITS/TRAYS/PACK) ×3 IMPLANT
SHEARS HARMONIC ACE PLUS 36CM (ENDOMECHANICALS) ×3 IMPLANT
SOLUTION ELECTROLUBE (MISCELLANEOUS) ×3 IMPLANT
SPONGE GAUZE 2X2 8PLY STRL LF (GAUZE/BANDAGES/DRESSINGS) ×6 IMPLANT
STRIP CLOSURE SKIN 1/2X4 (GAUZE/BANDAGES/DRESSINGS) ×3 IMPLANT
SUT VIC AB 0 CT1 36 (SUTURE) ×6 IMPLANT
SUT VIC AB 2-0 UR6 27 (SUTURE) ×3 IMPLANT
SUT VIC AB 4-0 SH 27 (SUTURE) ×2
SUT VIC AB 4-0 SH 27XANBCTRL (SUTURE) ×4 IMPLANT
SYR 10ML LL (SYRINGE) ×3 IMPLANT
TROCAR ENDO BLADELESS 11MM (ENDOMECHANICALS) ×3 IMPLANT
TROCAR XCEL NON-BLD 5MMX100MML (ENDOMECHANICALS) ×3 IMPLANT
TROCAR XCEL UNIV SLVE 11M 100M (ENDOMECHANICALS) ×3 IMPLANT
TUBING INSUFFLATION (TUBING) ×3 IMPLANT

## 2018-10-28 NOTE — Anesthesia Postprocedure Evaluation (Signed)
Anesthesia Post Note  Patient: Debra Swanson  Procedure(s) Performed: LAPAROSCOPIC SUPRACERVICAL HYSTERECTOMY (N/A ) LAPAROSCOPIC BILATERAL SALPINGECTOMY (Bilateral )  Patient location during evaluation: PACU Anesthesia Type: General Level of consciousness: awake and alert and oriented Pain management: pain level controlled Vital Signs Assessment: post-procedure vital signs reviewed and stable Respiratory status: spontaneous breathing, nonlabored ventilation and respiratory function stable Cardiovascular status: blood pressure returned to baseline and stable Postop Assessment: no signs of nausea or vomiting Anesthetic complications: no     Last Vitals:  Vitals:   10/28/18 0907 10/28/18 1437  BP: 135/84 120/69  Pulse: 81 87  Resp: 17 13  Temp: (!) 36.2 C 36.4 C  SpO2: 100% 100%    Last Pain:  Vitals:   10/28/18 1521  TempSrc:   PainSc: (P) 7                  Prachi Oftedahl

## 2018-10-28 NOTE — Anesthesia Procedure Notes (Signed)
Procedure Name: Intubation Performed by: Lance Muss, CRNA Pre-anesthesia Checklist: Patient identified, Patient being monitored, Timeout performed, Emergency Drugs available and Suction available Patient Re-evaluated:Patient Re-evaluated prior to induction Oxygen Delivery Method: Circle system utilized Preoxygenation: Pre-oxygenation with 100% oxygen Induction Type: IV induction Ventilation: Mask ventilation without difficulty Laryngoscope Size: Mac and 3 Grade View: Grade II Tube type: Oral Tube size: 7.0 mm Number of attempts: 1 Airway Equipment and Method: Stylet Placement Confirmation: ETT inserted through vocal cords under direct vision,  positive ETCO2 and breath sounds checked- equal and bilateral Secured at: 22 cm Tube secured with: Tape Dental Injury: Teeth and Oropharynx as per pre-operative assessment

## 2018-10-28 NOTE — Progress Notes (Signed)
Patient ID: Debra Swanson, female   DOB: 10/06/1976, 42 y.o.   MRN: [id number] DOS . PAin 5/10 /ating clears . Adequate urine output  I have explained the procedure to the pt . Anticipate am d/c

## 2018-10-28 NOTE — Discharge Summary (Signed)
Physician Discharge Summary  Patient ID: Debra Swanson MRN: [id number] DOB/AGE: September 13, 1976 42 y.o.  Admit date: 10/28/2018 Discharge date: 10/29/2018  Admission Diagnoses: Menorrhagia , anemia Discharge Diagnoses: Menorrhagia , anemia , endometriosis left pelvic side wall  Active Problems:   Post-operative state   Postoperative state   Discharged Condition: good  Hospital Course: underwent an uncomplicated LSH and bilateral salpingectomy  -  On POD#1 :  Patient was felt to be stable for discharge on postoperative day number 1 when she was tolerating a regular diet, pain was controlled with po pain medications, and she was ambulating and voiding without difficulty. Vital signs were stable and physical exam remained benign throughout her hospital stay. She will follow up per below for post-op check with Dr Ouida Sills.  Rx given for Norco, Ibuprofen, Zofran, and Colace.    She was given specific instructions and numbers to call in written and verbal format. She verbalized understanding, agrees with the plan of care, and all questions answered to her satisfaction.   Consults: None  Significant Diagnostic Studies: labs:  Results for orders placed or performed during the hospital encounter of 10/28/18 (from the past 24 hour(s))  CBC     Status: Abnormal   Collection Time: 10/28/18  5:19 PM  Result Value Ref Range   WBC 15.7 (H) 4.0 - 10.5 K/uL   RBC 4.63 3.87 - 5.11 MIL/uL   Hemoglobin 9.5 (L) 12.0 - 15.0 g/dL   HCT 33.1 (L) 36.0 - 46.0 %   MCV 71.5 (L) 80.0 - 100.0 fL   MCH 20.5 (L) 26.0 - 34.0 pg   MCHC 28.7 (L) 30.0 - 36.0 g/dL   RDW 24.1 (H) 11.5 - 15.5 %   Platelets 457 (H) 150 - 400 K/uL   nRBC 0.0 0.0 - 0.2 %    Treatments: surgery: as above   Discharge Exam: Blood pressure 108/69, pulse 88, temperature 97.9 F (36.6 C), temperature source Oral, resp. rate (!) 99, height 5\' 7"  (1.702 m), weight (!) 154.7 kg, last menstrual period 10/15/2018, SpO2 98  %.  General: NAD CV: RRR Pulm: CTABL, nl effort ABD: s/nd/nt Incision: c/d/i: Her incisions were CDI, right side incision had small amt of serosanguinous drainage earlier per nursing but none on current exam by CNM. Well approximated. DVT Evaluation: LE non-ttp, no evidence of DVT on exam.  Disposition: Discharge disposition: 01-Home or Self Care     Home  Pelvic rest 4 weeks .     Allergies as of 10/29/2018   No Known Allergies     Medication List    STOP taking these medications   acetaminophen 500 MG tablet Commonly known as:  TYLENOL     TAKE these medications   BELVIQ 10 MG Tabs Generic drug:  Lorcaserin HCl Take 10 mg by mouth 2 (two) times daily.   docusate sodium 100 MG capsule Commonly known as:  COLACE Take 1 capsule (100 mg total) by mouth 2 (two) times daily as needed.   FERROUSUL 325 (65 FE) MG tablet Generic drug:  ferrous sulfate Take 325 mg by mouth 2 (two) times daily.   ibuprofen 600 MG tablet Commonly known as:  ADVIL,MOTRIN Take 1 tablet (600 mg total) by mouth every 6 (six) hours as needed.   metFORMIN 500 MG tablet Commonly known as:  GLUCOPHAGE Take 500 mg by mouth daily.   multivitamin with minerals Tabs tablet Take 1 tablet by mouth daily.   ondansetron 4 MG tablet Commonly known as:  ZOFRAN  Take 1 tablet (4 mg total) by mouth every 6 (six) hours as needed for nausea.   vitamin C 500 MG tablet Commonly known as:  ASCORBIC ACID Take 500 mg by mouth daily.   ZZZQUIL 50 MG/30ML Liqd Generic drug:  diphenhydrAMINE HCl Take 50 mg by mouth at bedtime as needed (sleep).      Follow-up Information    Schermerhorn, Gwen Her, MD Follow up in 2 week(s).   Specialty:  Obstetrics and Gynecology Contact information: [address] [place] [address] [phone]           Signed: Francetta Found 10/29/2018, 10:16 AM

## 2018-10-28 NOTE — Transfer of Care (Signed)
Immediate Anesthesia Transfer of Care Note  Patient: Debra Swanson  Procedure(s) Performed: LAPAROSCOPIC SUPRACERVICAL HYSTERECTOMY (N/A ) LAPAROSCOPIC BILATERAL SALPINGECTOMY (Bilateral )  Patient Location: PACU  Anesthesia Type:General  Level of Consciousness: drowsy and responds to stimulation  Airway & Oxygen Therapy: Patient Spontanous Breathing and Patient connected to face mask oxygen  Post-op Assessment: Report given to RN and Post -op Vital signs reviewed and stable  Post vital signs: Reviewed and stable  Last Vitals:  Vitals Value Taken Time  BP 120/69 10/28/2018  2:37 PM  Temp    Pulse 87 10/28/2018  2:37 PM  Resp 13 10/28/2018  2:37 PM  SpO2 100 % 10/28/2018  2:37 PM    Last Pain:  Vitals:   10/28/18 0907  TempSrc: Tympanic  PainSc: 0-No pain         Complications: No apparent anesthesia complications

## 2018-10-28 NOTE — Anesthesia Preprocedure Evaluation (Signed)
Anesthesia Evaluation  Patient identified by MRN, date of birth, ID band Patient awake    Reviewed: Allergy & Precautions, NPO status , Patient's Chart, lab work & pertinent test results  History of Anesthesia Complications Negative for: history of anesthetic complications  Airway Mallampati: III  TM Distance: >3 FB Neck ROM: Full    Dental no notable dental hx.    Pulmonary neg pulmonary ROS, neg sleep apnea, neg COPD,    breath sounds clear to auscultation- rhonchi (-) wheezing      Cardiovascular Exercise Tolerance: Good hypertension, (-) CAD, (-) Past MI, (-) Cardiac Stents and (-) CABG  Rhythm:Regular Rate:Normal - Systolic murmurs and - Diastolic murmurs    Neuro/Psych neg Seizures negative neurological ROS  negative psych ROS   GI/Hepatic negative GI ROS, Neg liver ROS,   Endo/Other  diabetes, Oral Hypoglycemic Agents  Renal/GU negative Renal ROS     Musculoskeletal negative musculoskeletal ROS (+)   Abdominal (+) + obese,   Peds  Hematology  (+) anemia ,   Anesthesia Other Findings Past Medical History: No date: Anemia No date: Fibrocystic breast No date: Hyperglycemia No date: Hypertension No date: Pre-diabetes   Reproductive/Obstetrics                            Anesthesia Physical Anesthesia Plan  ASA: II  Anesthesia Plan: General   Post-op Pain Management:    Induction: Intravenous  PONV Risk Score and Plan: 2 and Ondansetron, Dexamethasone and Midazolam  Airway Management Planned: Oral ETT  Additional Equipment:   Intra-op Plan:   Post-operative Plan: Extubation in OR  Informed Consent: I have reviewed the patients History and Physical, chart, labs and discussed the procedure including the risks, benefits and alternatives for the proposed anesthesia with the patient or authorized representative who has indicated his/her understanding and acceptance.    Dental advisory given  Plan Discussed with: CRNA and Anesthesiologist  Anesthesia Plan Comments:         Anesthesia Quick Evaluation

## 2018-10-28 NOTE — Brief Op Note (Signed)
10/28/2018  2:19 PM  PATIENT:  Debra Swanson  42 y.o. female  PRE-OPERATIVE DIAGNOSIS:  Menorrhagia, anemia  POST-OPERATIVE DIAGNOSIS:  Menorrhagia, anemia   PROCEDURE:  Procedure(s): LAPAROSCOPIC SUPRACERVICAL HYSTERECTOMY (N/A) LAPAROSCOPIC BILATERAL SALPINGECTOMY (Bilateral)  SURGEON:  Surgeon(s) and Role:    * Schermerhorn, Gwen Her, MD - Primary    * Benjaman Kindler, MD - Assisting  PHYSICIAN ASSISTANT:   ASSISTANTS: none   ANESTHESIA:   general  EBL:  10 mL   BLOOD ADMINISTERED:none  DRAINS: Urinary Catheter (Foley)   LOCAL MEDICATIONS USED:  LIDOCAINE   SPECIMEN:  Source of Specimen:  uterus  and bilateral tubes   DISPOSITION OF SPECIMEN:  PATHOLOGY  COUNTS:  YES  TOURNIQUET:  * No tourniquets in log *  DICTATION: .Other Dictation: Dictation Number verbal  PLAN OF CARE: Admit for overnight observation  PATIENT DISPOSITION:  PACU - hemodynamically stable.   Delay start of Pharmacological VTE agent (>24hrs) due to surgical blood loss or risk of bleeding: not applicable

## 2018-10-28 NOTE — Progress Notes (Signed)
Pt is ready for [facility] and bilateral salpingectomy . All questions answered . LAbs reviewed . Proceed

## 2018-10-28 NOTE — Op Note (Signed)
NAME: Debra Swanson, Debra Swanson MEDICAL RECORD MV:[id number] ACCOUNT [id number] DATE OF BIRTH:05/30/1976 FACILITY: [facility] LOCATION: [facility] PHYSICIAN:Novelle Addair Josefine Class, MD  OPERATIVE REPORT  DATE OF PROCEDURE:  10/28/2018  PREOPERATIVE DIAGNOSES: 1.  Menorrhagia. 2.  Anemia.  POSTOPERATIVE DIAGNOSES: 1.  Menorrhagia. 2.  Anemia. 3.  Endometriosis.  ANESTHESIA:  General endotracheal anesthesia.  SURGEON:  Laverta Baltimore, MD.  FIRST ASSISTANT:  Benjaman Kindler, MD.  PROCEDURE:   1.  Laparoscopic supracervical hysterectomy. 2.  Bilateral salpingectomy.  INDICATIONS:  A 42 year old patient with a long history of heavy menorrhagia.  The patient was noted to have iron deficiency anemia with a ferritin level of 4.  The patient's endometrial biopsy and Pap smear were both normal.  The patient was counseled  regarding the risk of up staging occult uterine cancer with use of the morcellator.  FINDINGS:  A moderate size uterus, evidence of endometriosis on the left pelvic sidewall overlying the left ureter.  DESCRIPTION OF PROCEDURE:  After adequate general endotracheal anesthesia, the patient was placed in dorsal supine position.  The patient's legs were placed in the [place].  Abdomen, perineum and vagina were prepped and draped in normal sterile  fashion.  The patient did receive 3 grams IV Ancef prior to commencement of the case.  Timeout was performed.  A speculum exam was performed.  Anterior cervix was grasped with a single tooth tenaculum and a uterine sound was placed into the endometrial  cavity.  The 2 were tethered together with Steri-Strips.  Bladder was drained with a Foley catheter.  Gloves were changed.  Attention was directed to the patient's abdomen where a 5 mm infraumbilical incision was made after injecting with 0.5% Marcaine.   A 5 mm scope was advanced into the abdominal cavity under direct visualization.  A second port site was placed in the  left lower quadrant 3 cm medial to the left anterior iliac spine.  An 11 mm trocar was advanced under direct visualization.  A third  port site was placed in the right lower quadrant and also an 11 mm trocar was advanced under direct visualization, 3 cm medial to the right anterior iliac spine.  Attention was directed to the patient's left fallopian tube that was grasped with the  fimbriated end.  Harmonic scalpel was then used to dissect the mesosalpinx.  The uteroovarian ligaments were then transected and the broad ligament was transected to the level of the left uterine artery.  Uterine artery was skeletonized and then  cauterized with the Kleppinger cautery.  The left uterine artery was then transected.  Bladder flap was taken down and pushed caudad.  Similar procedure was repeated on the patient's right.  The right distal portion of the fallopian tube was grasped and  the mesosalpinx was then dissected free.  The broad ligament was then entered and this was taken down to the level of the right uterine artery again.  Again, this artery was cauterized, transected.  The cervix was then transected at the level of the  uterosacral ligaments.  The endometrial sound was removed and the endocervical stump was then cauterized with the Kleppinger cautery.  The morcellator was then brought up to the operative field, and the uterus and fallopian tubes were then morcellated  and brought through the left lower port site.  Essure coils were identified at the time of the procedure and were fragmented but felt to be totally removed at the time of the procedure.  Good hemostasis was noted.  The patient's abdomen  was irrigated and  pressure was lowered to 7 mmHg and good hemostasis was noted.  Upper abdomen appeared normal.  The lower abdominal ports sites were closed with a fascial layer of 2-0 Vicryl suture.  Good approximation of the fascial edges.  Good repair of the defects.   Carbon dioxide was then released from  the infraumbilical incision and that port site was removed as well.  All skin closures were closed with interrupted 4-0 Vicryl suture with good cosmetic effect.  Speculum exam at the end of the case revealed no  bleeding from the tenaculum site.  COMPLICATIONS:  There were no complications.  ESTIMATED BLOOD LOSS:  75 mL  URINE OUTPUT:  100 mL  INTRAOPERATIVE FLUIDS:  600 mL  DISPOSITION:  The patient was taken to recovery room in good condition.  TN/NUANCE  D:10/28/2018 T:10/28/2018 JOB:[id number]

## 2018-10-28 NOTE — Anesthesia Post-op Follow-up Note (Signed)
Anesthesia QCDR form completed.        

## 2018-10-29 ENCOUNTER — Encounter: Payer: Self-pay | Admitting: Obstetrics and Gynecology

## 2018-10-29 DIAGNOSIS — N800 Endometriosis of uterus: Secondary | ICD-10-CM | POA: Diagnosis not present

## 2018-10-29 MED ORDER — DOCUSATE SODIUM 100 MG PO CAPS: 100.0000 mg | ORAL_CAPSULE | Freq: Two times a day (BID) | ORAL | 2 refills | Status: AC | PRN

## 2018-10-29 MED ORDER — IBUPROFEN 600 MG PO TABS: 600.0000 mg | ORAL_TABLET | Freq: Four times a day (QID) | ORAL | 0 refills | Status: AC | PRN

## 2018-10-29 MED ORDER — ONDANSETRON HCL 4 MG PO TABS: 4.0000 mg | ORAL_TABLET | Freq: Four times a day (QID) | ORAL | 0 refills | Status: AC | PRN

## 2018-11-01 LAB — SURGICAL PATHOLOGY

## 2019-08-17 ENCOUNTER — Other Ambulatory Visit: Payer: Self-pay | Admitting: Physician Assistant

## 2019-08-17 DIAGNOSIS — Z1231 Encounter for screening mammogram for malignant neoplasm of breast: Secondary | ICD-10-CM

## 2019-10-04 ENCOUNTER — Other Ambulatory Visit: Payer: Self-pay

## 2019-10-04 DIAGNOSIS — Z20822 Contact with and (suspected) exposure to covid-19: Secondary | ICD-10-CM

## 2019-10-06 LAB — NOVEL CORONAVIRUS, NAA: SARS-CoV-2, NAA: NOT DETECTED

## 2020-04-18 ENCOUNTER — Ambulatory Visit
Admission: RE | Admit: 2020-04-18 | Discharge: 2020-04-18 | Disposition: A | Discharge status: Home / Self Care | Payer: BC Managed Care – PPO | Source: Ambulatory Visit | Attending: Physician Assistant | Admitting: Physician Assistant

## 2020-04-18 DIAGNOSIS — Z1231 Encounter for screening mammogram for malignant neoplasm of breast: Secondary | ICD-10-CM | POA: Diagnosis present

## 2020-04-24 ENCOUNTER — Other Ambulatory Visit: Payer: Self-pay | Admitting: Physician Assistant

## 2020-04-24 DIAGNOSIS — N6489 Other specified disorders of breast: Secondary | ICD-10-CM

## 2020-04-24 DIAGNOSIS — R928 Other abnormal and inconclusive findings on diagnostic imaging of breast: Secondary | ICD-10-CM

## 2020-04-24 DIAGNOSIS — N6310 Unspecified lump in the right breast, unspecified quadrant: Secondary | ICD-10-CM

## 2020-04-26 ENCOUNTER — Other Ambulatory Visit: Payer: BC Managed Care – PPO

## 2020-04-26 ENCOUNTER — Ambulatory Visit: Payer: BC Managed Care – PPO

## 2020-05-01 ENCOUNTER — Ambulatory Visit
Admission: RE | Admit: 2020-05-01 | Discharge: 2020-05-01 | Disposition: A | Discharge status: Home / Self Care | Payer: BC Managed Care – PPO | Source: Ambulatory Visit | Attending: Physician Assistant | Admitting: Physician Assistant

## 2020-05-01 DIAGNOSIS — R928 Other abnormal and inconclusive findings on diagnostic imaging of breast: Secondary | ICD-10-CM

## 2020-05-01 DIAGNOSIS — N6310 Unspecified lump in the right breast, unspecified quadrant: Secondary | ICD-10-CM | POA: Diagnosis present

## 2020-05-01 DIAGNOSIS — N6489 Other specified disorders of breast: Secondary | ICD-10-CM | POA: Insufficient documentation

## 2020-05-03 ENCOUNTER — Other Ambulatory Visit: Payer: Self-pay | Admitting: Physician Assistant

## 2020-05-03 DIAGNOSIS — R928 Other abnormal and inconclusive findings on diagnostic imaging of breast: Secondary | ICD-10-CM

## 2020-05-03 DIAGNOSIS — N6310 Unspecified lump in the right breast, unspecified quadrant: Secondary | ICD-10-CM

## 2020-05-07 ENCOUNTER — Ambulatory Visit
Admission: RE | Admit: 2020-05-07 | Discharge: 2020-05-07 | Disposition: A | Discharge status: Home / Self Care | Payer: BC Managed Care – PPO | Source: Ambulatory Visit | Attending: Physician Assistant | Admitting: Physician Assistant

## 2020-05-07 DIAGNOSIS — N6310 Unspecified lump in the right breast, unspecified quadrant: Secondary | ICD-10-CM | POA: Diagnosis present

## 2020-05-07 DIAGNOSIS — R928 Other abnormal and inconclusive findings on diagnostic imaging of breast: Secondary | ICD-10-CM | POA: Insufficient documentation

## 2020-05-08 LAB — SURGICAL PATHOLOGY

## 2021-03-03 ENCOUNTER — Other Ambulatory Visit: Payer: Self-pay | Admitting: Physician Assistant

## 2021-03-03 DIAGNOSIS — Z1231 Encounter for screening mammogram for malignant neoplasm of breast: Secondary | ICD-10-CM

## 2021-07-09 IMAGING — MG MM DIGITAL DIAGNOSTIC UNILAT*R* W/ TOMO W/ CAD
6 of 10 series · 6 of 30 positions shown · non-contrast
Comparison: Previous exam(s).

ACR Breast Density Category a: The breast tissue is almost entirely
fatty.

CLINICAL DATA: 44-year-old female presenting as a recall from
screening for possible right breast mass and separate possible right
breast asymmetry.

EXAM:
DIGITAL DIAGNOSTIC RIGHT MAMMOGRAM WITH TOMO
ULTRASOUND RIGHT BREAST

[R MLO synth-2D (1 of 2)]
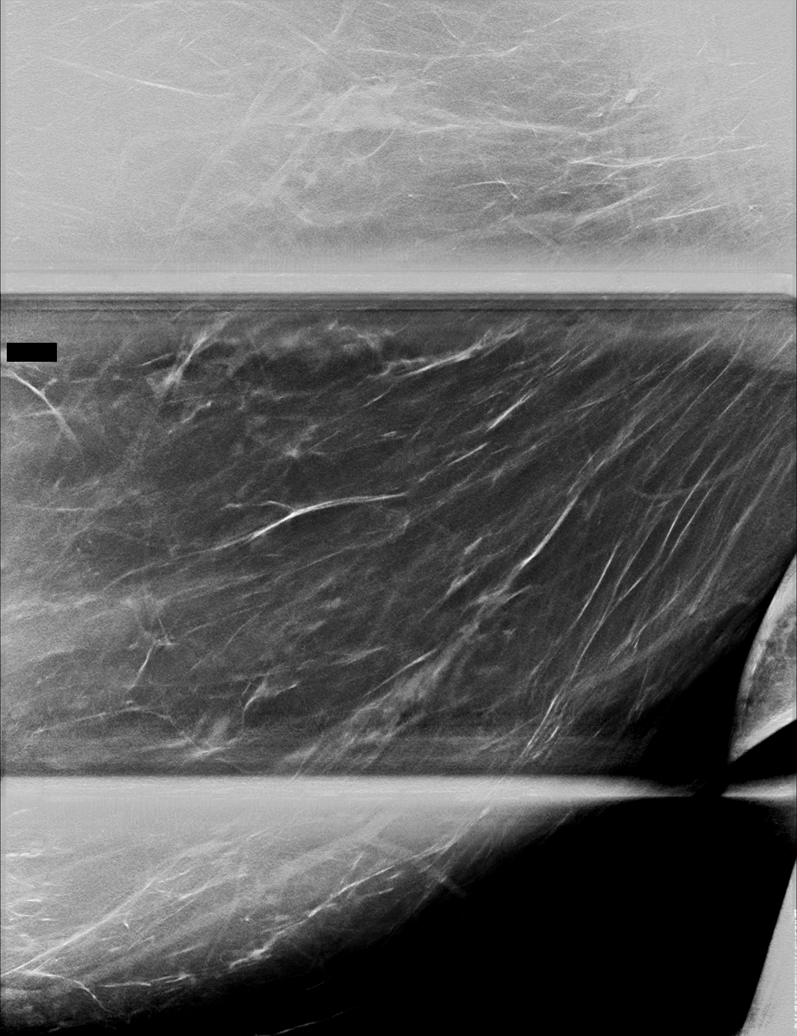

[R CC synth-2D (1 of 3)]
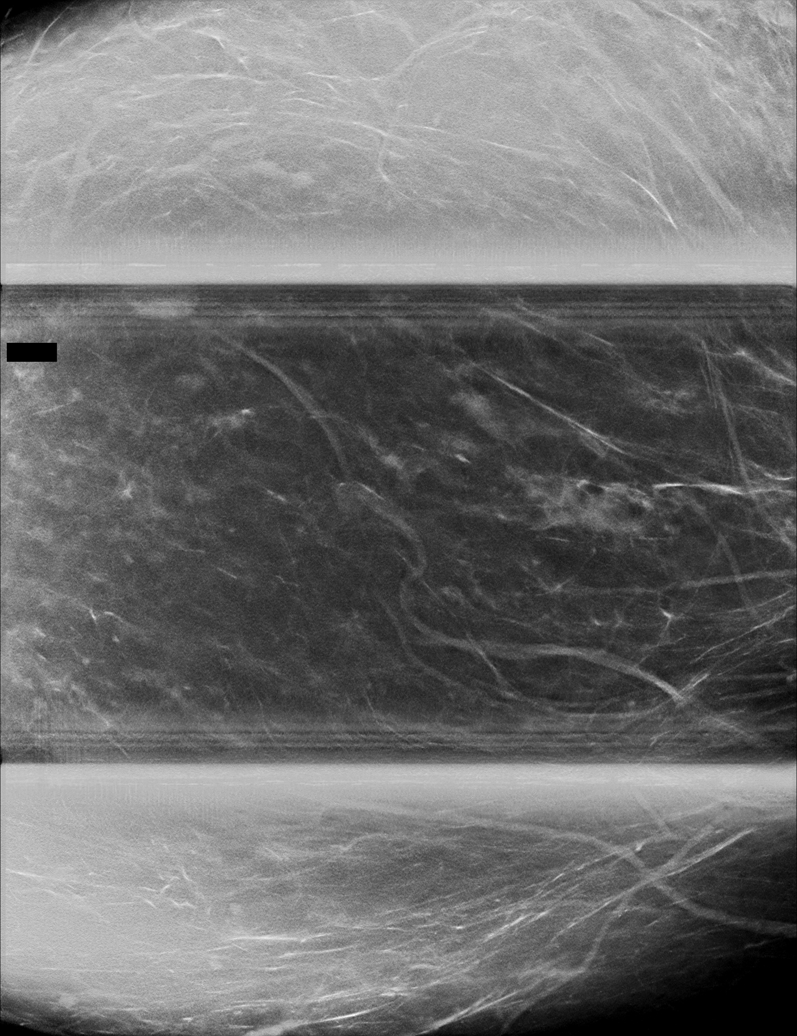

[R CC synth-2D (2 of 3)]
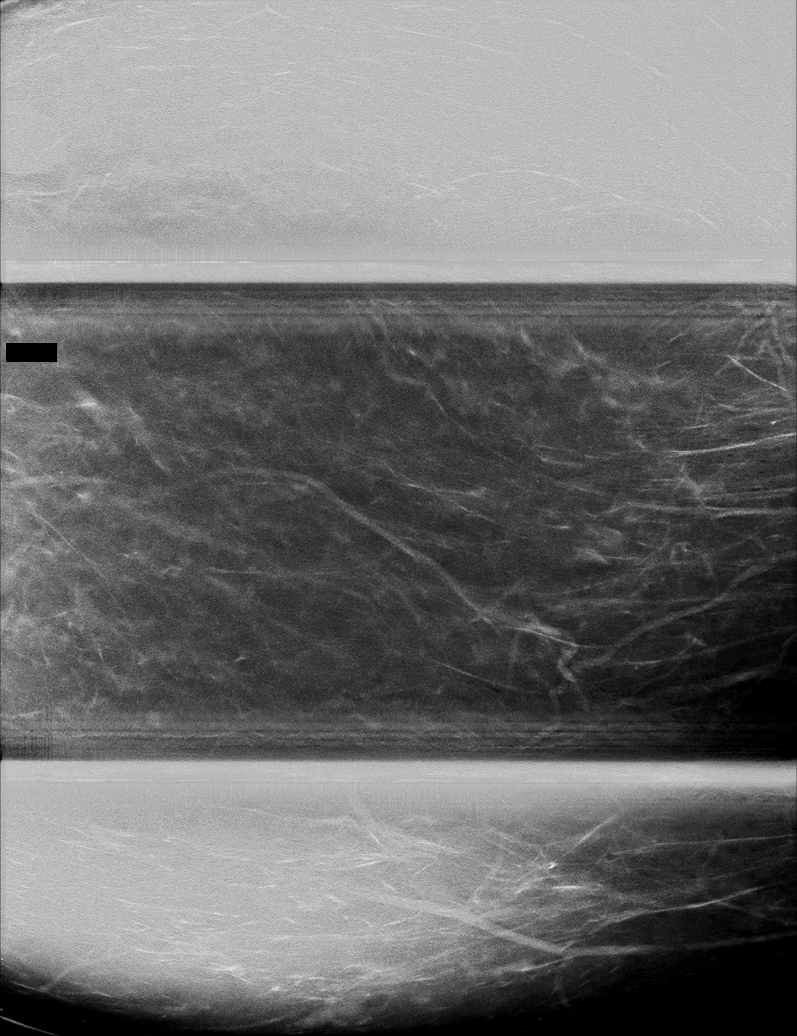

[R MLO synth-2D (2 of 2)]
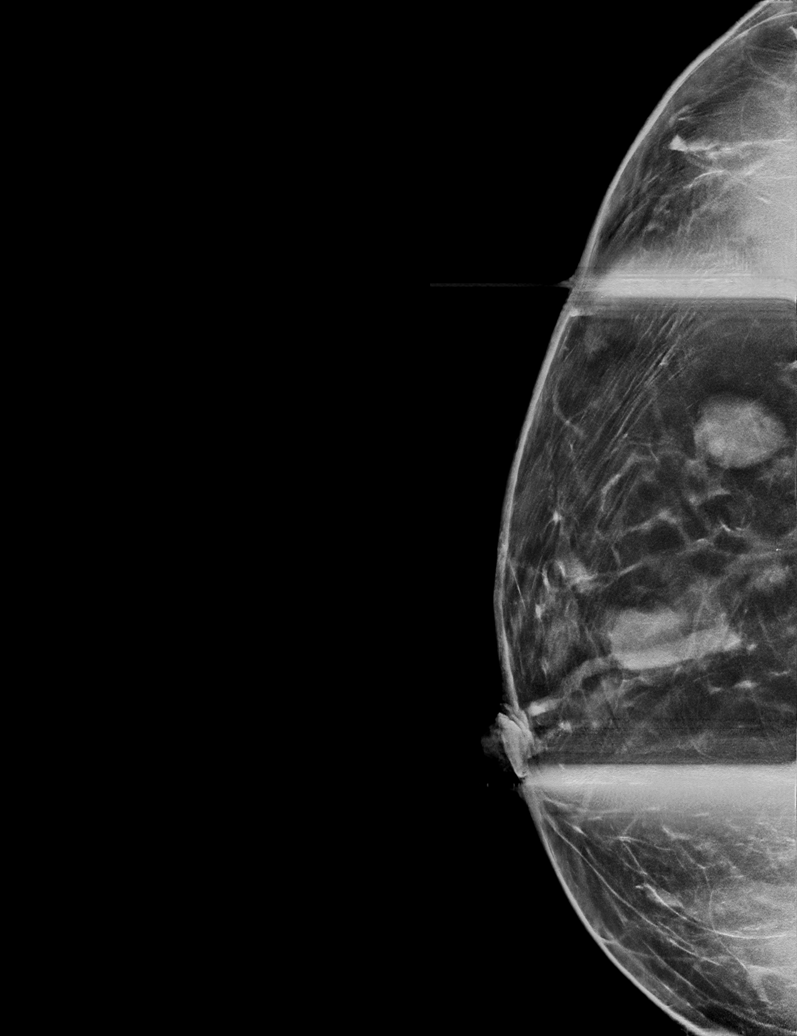

[R CC synth-2D (3 of 3)]
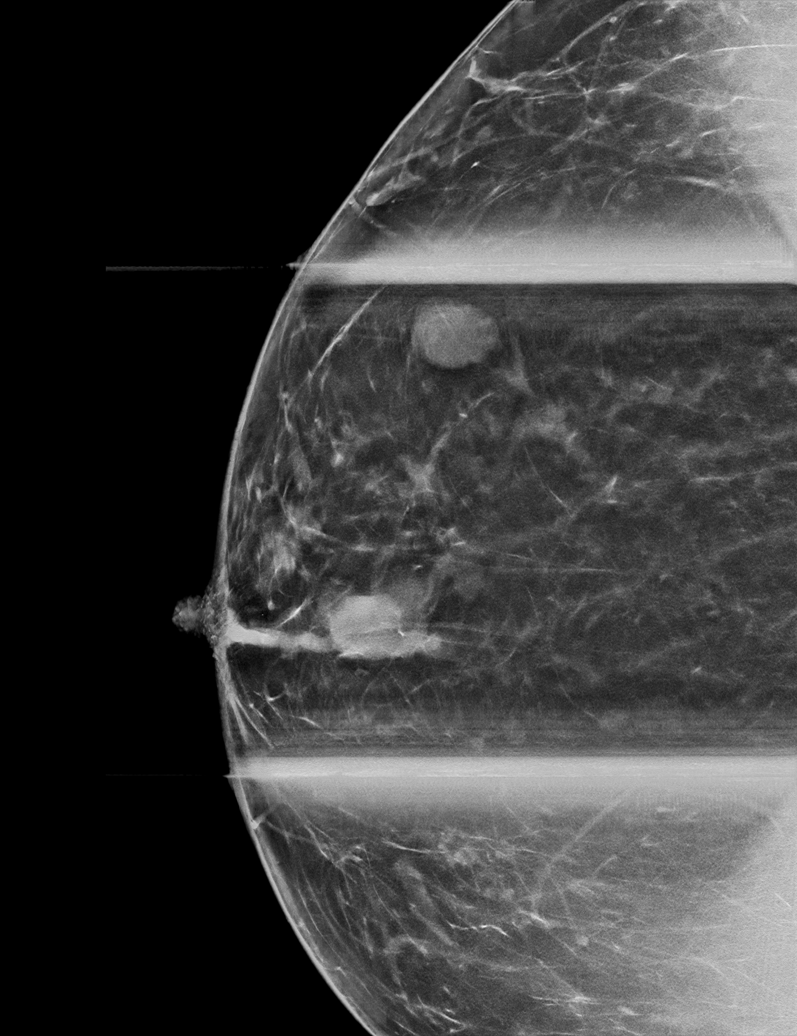

[R MLO tomo · tomo slice 31/60.0]
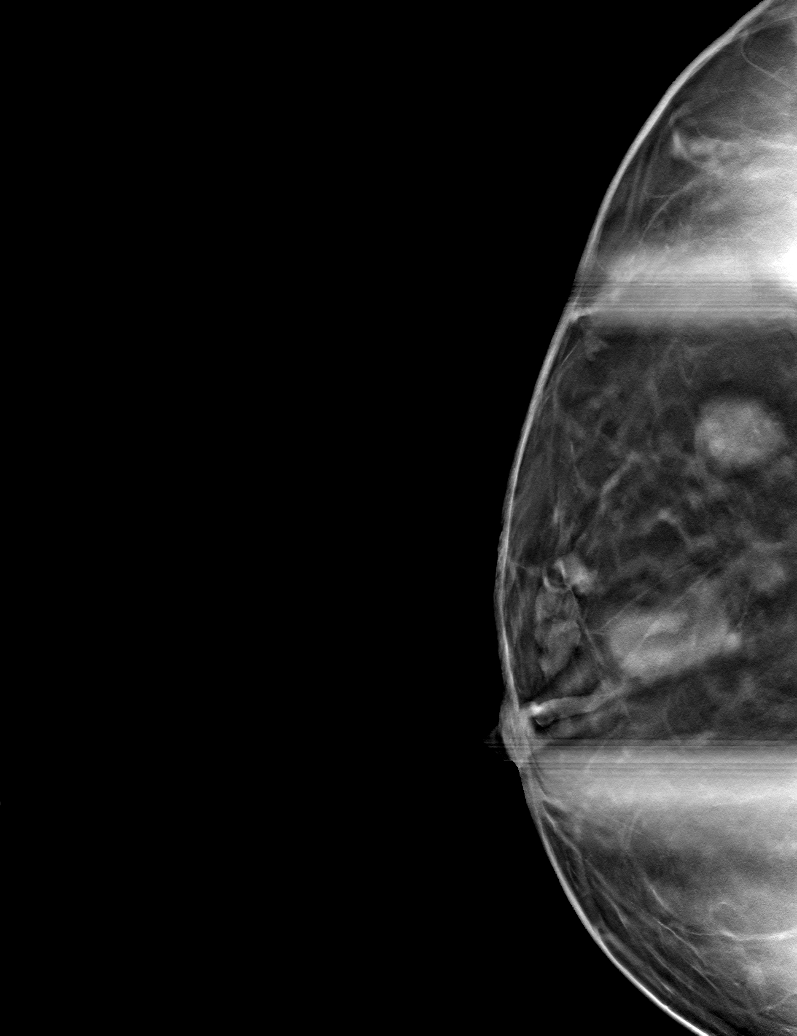

[6 of 30 positions shown; findings below may reference images not displayed]

FINDINGS: Mammogram:

Spot compression tomosynthesis views of the right breast were
performed.

There is persistence of a multilobulated mass in the
periareolar/superior right breast measuring approximately 2.5 cm.

The asymmetry questioned in the central inferior and far posterior
right breast persists but is less conspicuous on spot views. The
appearance is most suggestive of fibroglandular tissue. There is no
definite discrete mass.

Ultrasound:

Targeted ultrasound is performed in the right breast at 12 o'clock 2
cm from the nipple demonstrating grouped dilated ducts. A
representative duct is dilated to 0.8 cm and is filled with non
mobile debris versus solid components.

Targeted ultrasound performed throughout the inferior aspect of the
right breast demonstrates no cystic or solid mass or other finding
correspond to the asymmetry seen mammographically.

Targeted ultrasound of the right axilla demonstrates a few lymph
nodes with mild cortical thickening measuring up to 0.5 cm which are
similar to the left-sided axillary lymph nodes. These lymph nodes
retain normal shape and fatty hila.
IMPRESSION: 1. Right breast mass at 12 o'clock consistent with dilated ducts
containing non mobile debris versus solid components.

2. Right breast central inferior asymmetry without sonographic
correlate is probably benign.

RECOMMENDATION:
1.  Ultrasound-guided core needle biopsy of a duct at 12 o'clock.

2. If the biopsy returns benign recommend six-month follow-up of the
right breast asymmetry. If the biopsy returns as atypia or
malignancy recommend stereotactic core needle biopsy of the right
breast asymmetry.

I have discussed the findings and recommendations with the patient.
The patient will be contacted by our scheduling department to
arrange the biopsy appointment.

BI-RADS CATEGORY  4: Suspicious.

## 2021-09-03 LAB — HM MAMMOGRAPHY

## 2022-03-26 ENCOUNTER — Other Ambulatory Visit: Payer: Self-pay | Admitting: Physician Assistant

## 2022-03-26 DIAGNOSIS — N6489 Other specified disorders of breast: Secondary | ICD-10-CM

## 2022-04-16 ENCOUNTER — Ambulatory Visit
Admission: RE | Admit: 2022-04-16 | Discharge: 2022-04-16 | Disposition: A | Discharge status: Home / Self Care | Payer: BC Managed Care – PPO | Source: Ambulatory Visit | Attending: Physician Assistant | Admitting: Physician Assistant

## 2022-04-16 DIAGNOSIS — N6489 Other specified disorders of breast: Secondary | ICD-10-CM

## 2022-10-29 NOTE — H&P (Signed)
Pre-Procedure H&P   Patient ID: Debra Swanson is a 46 y.o. female.  Gastroenterology Provider: [name], DO  Referring Provider: Paulita Cradle, PA PCP: Paulita Cradle, PA  Date: 10/30/2022  HPI Ms. Debra Swanson is a 46 y.o. female who presents today for Colonoscopy for initial screening colonoscopy.  Patient on Mounjaro with last dose being taken on 10/18/2022  Status post Roux-en-Y gastric bypass, hysterectomy, cholecystectomy.  Reports a bm every 3 days. No mleena or hematochezia.  Has a history of chronic anemia on iron supplementation.  Most recent hemoglobin 11 MCV 79 platelets 419,000 creatinine 0.9.  Underwent EGD in 2017 and 2019 which were unremarkable.   Past Medical History:  Diagnosis Date   Anemia    Fibrocystic breast    Hyperglycemia    Hypertension    Pre-diabetes     Past Surgical History:  Procedure Laterality Date   BREAST CYST EXCISION Bilateral 1998-1999   benign, pt states "fibroids" removed, no scar seen. Has had prior ultrasounds of breasts in the late 1990's   CHOLECYSTECTOMY     ESOPHAGOGASTRODUODENOSCOPY     ESSURE TUBAL LIGATION     gastric bypass     2010   GASTRIC ROUX-EN-Y     KNEE ARTHROSCOPY WITH MENISCAL REPAIR Right 11/19/2017   Procedure: KNEE ARTHROSCOPY WITH MENISCAL REPAIR,MEDIAL MENISCECTOMY,CHONDROPLASTY;  Surgeon: Leim Fabry, MD;  Location: [facility];  Service: Orthopedics;  Laterality: Right;   LAPAROSCOPIC BILATERAL SALPINGECTOMY Bilateral 10/28/2018   Procedure: LAPAROSCOPIC BILATERAL SALPINGECTOMY;  Surgeon: Schermerhorn, Gwen Her, MD;  Location: [facility];  Service: Gynecology;  Laterality: Bilateral;   LAPAROSCOPIC SUPRACERVICAL HYSTERECTOMY N/A 10/28/2018   Procedure: LAPAROSCOPIC SUPRACERVICAL HYSTERECTOMY;  Surgeon: Schermerhorn, Gwen Her, MD;  Location: [facility];  Service: Gynecology;  Laterality: N/A;    Family History No h/o GI disease or malignancy  Review of  Systems  Constitutional:  Negative for activity change, appetite change, chills, diaphoresis, fatigue, fever and unexpected weight change.  HENT:  Negative for trouble swallowing and voice change.   Respiratory:  Negative for shortness of breath and wheezing.   Cardiovascular:  Negative for chest pain, palpitations and leg swelling.  Gastrointestinal:  Positive for constipation. Negative for abdominal distention, abdominal pain, anal bleeding, blood in stool, diarrhea, nausea, rectal pain and vomiting.  Musculoskeletal:  Negative for arthralgias and myalgias.  Skin:  Negative for color change and pallor.  Neurological:  Negative for dizziness, syncope and weakness.  Psychiatric/Behavioral:  Negative for confusion.      Medications No current facility-administered medications on file prior to encounter.   Current Outpatient Medications on File Prior to Encounter  Medication Sig Dispense Refill   ferrous sulfate (FERROUSUL) 325 (65 FE) MG tablet Take 325 mg by mouth 2 (two) times daily.     ibuprofen (ADVIL,MOTRIN) 600 MG tablet Take 1 tablet (600 mg total) by mouth every 6 (six) hours as needed. 60 tablet 0   Lorcaserin HCl (BELVIQ) 10 MG TABS Take 10 mg by mouth 2 (two) times daily.     Multiple Vitamin (MULTIVITAMIN WITH MINERALS) TABS tablet Take 1 tablet by mouth daily.     ondansetron (ZOFRAN) 4 MG tablet Take 1 tablet (4 mg total) by mouth every 6 (six) hours as needed for nausea. 20 tablet 0   Tirzepatide (MOUNJARO [place]) Inject 15 mg into the skin once a week.     vitamin C (ASCORBIC ACID) 500 MG tablet Take 500 mg by mouth daily.     diphenhydrAMINE HCl (  ZZZQUIL) 50 MG/30ML LIQD Take 50 mg by mouth at bedtime as needed (sleep).     docusate sodium (COLACE) 100 MG capsule Take 1 capsule (100 mg total) by mouth 2 (two) times daily as needed. 30 capsule 2   metFORMIN (GLUCOPHAGE) 500 MG tablet Take 500 mg by mouth daily.       Pertinent medications related to GI and procedure were  reviewed by me with the patient prior to the procedure   Current Facility-Administered Medications:    0.9 %  sodium chloride infusion, , Intravenous, Continuous, [name], DO      No Known Allergies Allergies were reviewed by me prior to the procedure  Objective   Body mass index is 53.42 kg/m. Vitals:   10/30/22 0825  BP: (!) 142/107  Pulse: 91  Resp: 20  Temp: 98.7 F (37.1 C)  TempSrc: Temporal  SpO2: 98%  Weight: (!) 150.1 kg  Height: '5\' 6"'$  (1.676 m)     Physical Exam Vitals and nursing note reviewed.  Constitutional:      General: She is not in acute distress.    Appearance: Normal appearance. She is obese. She is not ill-appearing, toxic-appearing or diaphoretic.  HENT:     Head: Normocephalic and atraumatic.     Nose: Nose normal.     Mouth/Throat:     Mouth: Mucous membranes are moist.     Pharynx: Oropharynx is clear.  Eyes:     General: No scleral icterus.    Extraocular Movements: Extraocular movements intact.  Cardiovascular:     Rate and Rhythm: Normal rate and regular rhythm.     Heart sounds: Normal heart sounds. No murmur heard.    No friction rub. No gallop.  Pulmonary:     Effort: Pulmonary effort is normal. No respiratory distress.     Breath sounds: Normal breath sounds. No wheezing, rhonchi or rales.  Abdominal:     General: Bowel sounds are normal. There is no distension.     Palpations: Abdomen is soft.     Tenderness: There is no abdominal tenderness. There is no guarding or rebound.  Musculoskeletal:     Cervical back: Neck supple.     Right lower leg: No edema.     Left lower leg: No edema.  Skin:    General: Skin is warm and dry.     Coloration: Skin is not jaundiced or pale.  Neurological:     General: No focal deficit present.     Mental Status: She is alert and oriented to person, place, and time. Mental status is at baseline.  Psychiatric:        Mood and Affect: Mood normal.        Behavior: Behavior  normal.        Thought Content: Thought content normal.        Judgment: Judgment normal.      Assessment:  Ms. Debra Swanson is a 46 y.o. female  who presents today for Colonoscopy for initial screening colonoscopy.  Plan:  Colonoscopy with possible intervention today  Colonoscopy with possible biopsy, control of bleeding, polypectomy, and interventions as necessary has been discussed with the patient/patient representative. Informed consent was obtained from the patient/patient representative after explaining the indication, nature, and risks of the procedure including but not limited to death, bleeding, perforation, missed neoplasm/lesions, cardiorespiratory compromise, and reaction to medications. Opportunity for questions was given and appropriate answers were provided. Patient/patient representative has verbalized understanding is amenable to undergoing  the procedure.   [name], DO  [facility]  Portions of the record may have been created with voice recognition software. Occasional wrong-word or 'sound-a-like' substitutions may have occurred due to the inherent limitations of voice recognition software.  Read the chart carefully and recognize, using context, where substitutions may have occurred.

## 2022-10-30 ENCOUNTER — Encounter
Admission: RE | Disposition: A | Discharge status: Home / Self Care | Payer: Self-pay | Source: Home / Self Care | Attending: Gastroenterology

## 2022-10-30 ENCOUNTER — Ambulatory Visit: Payer: BC Managed Care – PPO | Admitting: Anesthesiology

## 2022-10-30 ENCOUNTER — Ambulatory Visit: Admit: 2022-10-30 | Payer: BC Managed Care – PPO | Admitting: Gastroenterology

## 2022-10-30 ENCOUNTER — Ambulatory Visit
Admission: RE | Admit: 2022-10-30 | Discharge: 2022-10-30 | Disposition: A | Discharge status: Home / Self Care | Payer: BC Managed Care – PPO | Attending: Gastroenterology | Admitting: Gastroenterology

## 2022-10-30 ENCOUNTER — Encounter: Payer: Self-pay | Admitting: Gastroenterology

## 2022-10-30 DIAGNOSIS — R7303 Prediabetes: Secondary | ICD-10-CM | POA: Diagnosis not present

## 2022-10-30 DIAGNOSIS — Z6841 Body Mass Index (BMI) 40.0 and over, adult: Secondary | ICD-10-CM | POA: Insufficient documentation

## 2022-10-30 DIAGNOSIS — Z9884 Bariatric surgery status: Secondary | ICD-10-CM | POA: Insufficient documentation

## 2022-10-30 DIAGNOSIS — Z1211 Encounter for screening for malignant neoplasm of colon: Secondary | ICD-10-CM | POA: Diagnosis present

## 2022-10-30 DIAGNOSIS — K640 First degree hemorrhoids: Secondary | ICD-10-CM | POA: Diagnosis not present

## 2022-10-30 DIAGNOSIS — Z9049 Acquired absence of other specified parts of digestive tract: Secondary | ICD-10-CM | POA: Insufficient documentation

## 2022-10-30 DIAGNOSIS — I10 Essential (primary) hypertension: Secondary | ICD-10-CM | POA: Insufficient documentation

## 2022-10-30 DIAGNOSIS — D124 Benign neoplasm of descending colon: Secondary | ICD-10-CM | POA: Diagnosis not present

## 2022-10-30 DIAGNOSIS — Z79899 Other long term (current) drug therapy: Secondary | ICD-10-CM | POA: Diagnosis not present

## 2022-10-30 DIAGNOSIS — D649 Anemia, unspecified: Secondary | ICD-10-CM | POA: Insufficient documentation

## 2022-10-30 HISTORY — PX: COLONOSCOPY WITH PROPOFOL: SHX5780

## 2022-10-30 SURGERY — COLONOSCOPY
Anesthesia: General

## 2022-10-30 SURGERY — COLONOSCOPY WITH PROPOFOL
Anesthesia: General

## 2022-10-30 MED ORDER — GLYCOPYRROLATE 0.2 MG/ML IJ SOLN
INTRAMUSCULAR | Status: DC | PRN
  Administered 2022-10-30: .2 mg via INTRAVENOUS

## 2022-10-30 MED ORDER — SODIUM CHLORIDE 0.9 % IV SOLN: INTRAVENOUS | Status: DC

## 2022-10-30 MED ORDER — PROPOFOL 500 MG/50ML IV EMUL
INTRAVENOUS | Status: DC | PRN
  Administered 2022-10-30: 165 ug/kg/min via INTRAVENOUS

## 2022-10-30 MED ORDER — LIDOCAINE HCL (CARDIAC) PF 100 MG/5ML IV SOSY
PREFILLED_SYRINGE | INTRAVENOUS | Status: DC | PRN
  Administered 2022-10-30: 100 mg via INTRAVENOUS

## 2022-10-30 MED ORDER — PROPOFOL 10 MG/ML IV BOLUS
INTRAVENOUS | Status: DC | PRN
  Administered 2022-10-30 (×2): 20 mg via INTRAVENOUS
  Administered 2022-10-30: 60 mg via INTRAVENOUS

## 2022-10-30 MED ORDER — MIDAZOLAM HCL 2 MG/2ML IJ SOLN
INTRAMUSCULAR | Status: AC
  Filled 2022-10-30: qty 2

## 2022-10-30 MED ORDER — MIDAZOLAM HCL 2 MG/2ML IJ SOLN
INTRAMUSCULAR | Status: DC | PRN
  Administered 2022-10-30: 2 mg via INTRAVENOUS

## 2022-10-30 NOTE — Op Note (Signed)
[facility] Patient Name: Debra Swanson Procedure Date: 10/30/2022 8:07 AM MRN: [id number] Account #: [id number] Date of Birth: 08-03-1976 Admit Type: Outpatient Age: 46 Room: [facility] ENDO ROOM 3 Gender: Female Note Status: Finalized Instrument Name: Colonscope [id number] Procedure:             Colonoscopy Indications:           Screening for colorectal malignant neoplasm Providers:             [name] DO, DO Referring MD:          Precious Bard, MD (Referring MD) Medicines:             Monitored Anesthesia Care Complications:         No immediate complications. Estimated blood loss:                         Minimal. Procedure:             Pre-Anesthesia Assessment:                        - Prior to the procedure, a History and Physical was                         performed, and patient medications and allergies were                         reviewed. The patient is competent. The risks and                         benefits of the procedure and the sedation options and                         risks were discussed with the patient. All questions                         were answered and informed consent was obtained.                         Patient identification and proposed procedure were                         verified by the physician, the nurse, the anesthetist                         and the technician in the endoscopy suite. Mental                         Status Examination: alert and oriented. Airway                         Examination: normal oropharyngeal airway and neck                         mobility. Respiratory Examination: clear to                         auscultation. CV Examination: RRR, no murmurs, no S3  or S4. Prophylactic Antibiotics: The patient does not                         require prophylactic antibiotics. Prior                         Anticoagulants: The patient has taken no  anticoagulant                         or antiplatelet agents. ASA Grade Assessment: III - A                         patient with severe systemic disease. After reviewing                         the risks and benefits, the patient was deemed in                         satisfactory condition to undergo the procedure. The                         anesthesia plan was to use monitored anesthesia care                         (MAC). Immediately prior to administration of                         medications, the patient was re-assessed for adequacy                         to receive sedatives. The heart rate, respiratory                         rate, oxygen saturations, blood pressure, adequacy of                         pulmonary ventilation, and response to care were                         monitored throughout the procedure. The physical                         status of the patient was re-assessed after the                         procedure.                        After obtaining informed consent, the colonoscope was                         passed under direct vision. Throughout the procedure,                         the patient's blood pressure, pulse, and oxygen                         saturations were monitored continuously. The  Colonoscope was introduced through the anus and                         advanced to the the terminal ileum, with                         identification of the appendiceal orifice and IC                         valve. The colonoscopy was performed without                         difficulty. The patient tolerated the procedure well.                         The quality of the bowel preparation was evaluated                         using the BBPS [facility] Bowel Preparation Scale) with                         scores of: Right Colon = 3, Transverse Colon = 3 and                         Left Colon = 3 (entire mucosa seen well with no                          residual staining, small fragments of stool or opaque                         liquid). The total BBPS score equals 9. The ileocecal                         valve, appendiceal orifice, and rectum were                         photographed. Findings:      The perianal and digital rectal examinations were normal. Pertinent       negatives include normal sphincter tone.      The terminal ileum appeared normal. Estimated blood loss: none.      A 1 to 2 mm polyp was found in the sigmoid colon. The polyp was sessile.       The polyp was removed with a jumbo cold forceps. Resection and retrieval       were complete. Estimated blood loss was minimal.      Non-bleeding internal hemorrhoids were found during retroflexion. The       hemorrhoids were Grade I (internal hemorrhoids that do not prolapse).      Retroflexion in the right colon was performed.      The exam was otherwise without abnormality on direct and retroflexion       views. Impression:            - The examined portion of the ileum was normal.                        - One 1 to 2 mm polyp in the sigmoid colon, removed  with a jumbo cold forceps. Resected and retrieved.                        - Non-bleeding internal hemorrhoids.                        - The examination was otherwise normal on direct and                         retroflexion views. Recommendation:        - Patient has a contact number available for                         emergencies. The signs and symptoms of potential                         delayed complications were discussed with the patient.                         Return to normal activities tomorrow. Written                         discharge instructions were provided to the patient.                        - Discharge patient to home.                        - Resume previous diet.                        - Continue present medications.                        - Await pathology results.                         - Repeat colonoscopy for surveillance based on                         pathology results.                        - Return to referring physician as previously                         scheduled. Procedure Code(s):     --- Professional ---                        [phone], Colonoscopy, flexible; with biopsy, single or                         multiple Diagnosis Code(s):     --- Professional ---                        Z12.11, Encounter for screening for malignant neoplasm                         of colon  K64.0, First degree hemorrhoids                        D12.5, Benign neoplasm of sigmoid colon CPT copyright 2022 American Medical Association. All rights reserved. The codes documented in this report are preliminary and upon coder review may  be revised to meet current compliance requirements. Attending Participation:      I personally performed the entire procedure. Volney American, DO [name] DO, DO 10/30/2022 9:19:09 AM This report has been signed electronically. Number of Addenda: 0 Note Initiated On: 10/30/2022 8:07 AM Scope Withdrawal Time: 0 hours 18 minutes 7 seconds  Total Procedure Duration: 0 hours 22 minutes 26 seconds  Estimated Blood Loss:  Estimated blood loss was minimal.      [facility]

## 2022-10-30 NOTE — Anesthesia Procedure Notes (Signed)
Procedure Name: General with mask airway Date/Time: 10/30/2022 8:54 AM  Performed by: Kelton Pillar, CRNAPre-anesthesia Checklist: Patient identified, Emergency Drugs available, Suction available and Patient being monitored Patient Re-evaluated:Patient Re-evaluated prior to induction Oxygen Delivery Method: Simple face mask Induction Type: IV induction Placement Confirmation: positive ETCO2, CO2 detector and breath sounds checked- equal and bilateral Dental Injury: Teeth and Oropharynx as per pre-operative assessment

## 2022-10-30 NOTE — Transfer of Care (Signed)
Immediate Anesthesia Transfer of Care Note  Patient: Debra Swanson  Procedure(s) Performed: COLONOSCOPY WITH PROPOFOL  Patient Location: Endoscopy Unit  Anesthesia Type:General  Level of Consciousness: drowsy and patient cooperative  Airway & Oxygen Therapy: Patient Spontanous Breathing and Patient connected to face mask oxygen  Post-op Assessment: Report given to RN and Post -op Vital signs reviewed and stable  Post vital signs: Reviewed and stable  Last Vitals:  Vitals Value Taken Time  BP 102/72 10/30/22 0915  Temp    Pulse 88 10/30/22 0916  Resp 18 10/30/22 0916  SpO2 100 % 10/30/22 0916  Vitals shown include unvalidated device data.  Last Pain:  Vitals:   10/30/22 0825  TempSrc: Temporal  PainSc: 0-No pain         Complications: No notable events documented.

## 2022-10-30 NOTE — Anesthesia Preprocedure Evaluation (Addendum)
Anesthesia Evaluation  Patient identified by MRN, date of birth, ID band Patient awake    Reviewed: Allergy & Precautions, NPO status , Patient's Chart, lab work & pertinent test results  Airway Mallampati: III  TM Distance: >3 FB Neck ROM: full    Dental no notable dental hx.    Pulmonary neg pulmonary ROS   Pulmonary exam normal        Cardiovascular hypertension, Normal cardiovascular exam     Neuro/Psych negative neurological ROS  negative psych ROS   GI/Hepatic negative GI ROS, Neg liver ROS,,,  Endo/Other    Morbid obesityPre-diabetes  Renal/GU negative Renal ROS  negative genitourinary   Musculoskeletal   Abdominal  (+) + obese  Peds  Hematology  (+) Blood dyscrasia, anemia   Anesthesia Other Findings Past Medical History: No date: Anemia No date: Fibrocystic breast No date: Hyperglycemia No date: Hypertension No date: Pre-diabetes  Past Surgical History: 1998-1999: BREAST CYST EXCISION; Bilateral     Comment:  benign, pt states "fibroids" removed, no scar seen. Has               had prior ultrasounds of breasts in the late 1990's No date: CHOLECYSTECTOMY No date: ESOPHAGOGASTRODUODENOSCOPY No date: ESSURE TUBAL LIGATION No date: gastric bypass     Comment:  2010 No date: GASTRIC ROUX-EN-Y 11/19/2017: KNEE ARTHROSCOPY WITH MENISCAL REPAIR; Right     Comment:  Procedure: KNEE ARTHROSCOPY WITH MENISCAL REPAIR,MEDIAL               MENISCECTOMY,CHONDROPLASTY;  Surgeon: Leim Fabry, MD;                Location: [facility] ORS;  Service: Orthopedics;  Laterality:               Right; 10/28/2018: LAPAROSCOPIC BILATERAL SALPINGECTOMY; Bilateral     Comment:  Procedure: LAPAROSCOPIC BILATERAL SALPINGECTOMY;                Surgeon: Schermerhorn, Gwen Her, MD;  Location: [facility] ORS;              Service: Gynecology;  Laterality: Bilateral; 10/28/2018: LAPAROSCOPIC SUPRACERVICAL HYSTERECTOMY; N/A     Comment:   Procedure: LAPAROSCOPIC SUPRACERVICAL HYSTERECTOMY;                Surgeon: Schermerhorn, Gwen Her, MD;  Location: [facility] ORS;              Service: Gynecology;  Laterality: N/A;     Reproductive/Obstetrics negative OB ROS                             Anesthesia Physical Anesthesia Plan  ASA: 3  Anesthesia Plan: General   Post-op Pain Management: Minimal or no pain anticipated   Induction: Intravenous  PONV Risk Score and Plan: Propofol infusion and TIVA  Airway Management Planned: Natural Airway  Additional Equipment:   Intra-op Plan:   Post-operative Plan:   Informed Consent: I have reviewed the patients History and Physical, chart, labs and discussed the procedure including the risks, benefits and alternatives for the proposed anesthesia with the patient or authorized representative who has indicated his/her understanding and acceptance.     Dental Advisory Given  Plan Discussed with: Anesthesiologist, CRNA and Surgeon  Anesthesia Plan Comments:         Anesthesia Quick Evaluation

## 2022-10-30 NOTE — Anesthesia Postprocedure Evaluation (Signed)
Anesthesia Post Note  Patient: Debra Swanson  Procedure(s) Performed: COLONOSCOPY WITH PROPOFOL  Patient location during evaluation: PACU Anesthesia Type: General Level of consciousness: awake and alert Pain management: pain level controlled Vital Signs Assessment: post-procedure vital signs reviewed and stable Respiratory status: spontaneous breathing, nonlabored ventilation and respiratory function stable Cardiovascular status: blood pressure returned to baseline and stable Postop Assessment: no apparent nausea or vomiting Anesthetic complications: no   No notable events documented.   Last Vitals:  Vitals:   10/30/22 0915 10/30/22 0935  BP: 102/72 117/79  Pulse:    Resp:    Temp: (!) 36.4 C   SpO2:  100%    Last Pain:  Vitals:   10/30/22 0935  TempSrc:   PainSc: 0-No pain                 [place] Ouch

## 2022-10-30 NOTE — Interval H&P Note (Signed)
History and Physical Interval Note: Preprocedure H&P from 10/30/22  was reviewed and there was no interval change after seeing and examining the patient.  Written consent was obtained from the patient after discussion of risks, benefits, and alternatives. Patient has consented to proceed with Colonoscopy with possible intervention   10/30/2022 8:43 AM  Debra Swanson  has presented today for surgery, with the diagnosis of Z12.11  - Colon cancer screening.  The various methods of treatment have been discussed with the patient and family. After consideration of risks, benefits and other options for treatment, the patient has consented to  Procedure(s): COLONOSCOPY WITH PROPOFOL (N/A) as a surgical intervention.  The patient's history has been reviewed, patient examined, no change in status, stable for surgery.  I have reviewed the patient's chart and labs.  Questions were answered to the patient's satisfaction.     [name]

## 2022-11-03 LAB — SURGICAL PATHOLOGY

## 2023-06-10 ENCOUNTER — Other Ambulatory Visit: Payer: Self-pay | Admitting: Physician Assistant

## 2023-06-10 DIAGNOSIS — Z1231 Encounter for screening mammogram for malignant neoplasm of breast: Secondary | ICD-10-CM

## 2023-07-06 ENCOUNTER — Ambulatory Visit
Admission: RE | Admit: 2023-07-06 | Discharge: 2023-07-06 | Disposition: A | Discharge status: Home / Self Care | Payer: BC Managed Care – PPO | Source: Ambulatory Visit | Attending: Physician Assistant | Admitting: Physician Assistant

## 2023-07-06 DIAGNOSIS — Z1231 Encounter for screening mammogram for malignant neoplasm of breast: Secondary | ICD-10-CM | POA: Insufficient documentation

## 2024-02-28 NOTE — Progress Notes (Signed)
 Diabetes Self-Management Education  Visit Type: First/Initial  Appt. Start Time: 1600 Appt. End Time: 1703  03/06/2024  Debra Swanson, identified by name and date of birth, is a 48 y.o. female with a diagnosis of Diabetes: Type 2.   ASSESSMENT  Patient is here today alone. Patient would like to learn how to eat to continue her weight loss journey. Patient reports she lives with alone and does her own shopping and cooking.Pt reports eating out about four times weekly.  Pt reports she is attempting to restrict to carbohydrates 20 grams daily. Pt reports a history of body weight losses followed by weight regain. Pt reports her weight loss has plateaued for the past 2-3 months. Pt reports she works full time mostly sitting down stating two jobs. Pt reports her challenge is time management. Pt reports average hour of sleep is "maybe 6 hours" nightly and feels that her sleep is poor. Pt reports for the past year she has maintained weight lifting for one hour three days weekly. Pt reports in addition she walks on her walk pad at home. Pt reprtos last month she began endurance boot camp five days weekly for 1 hour. Pt reports a previous roux eny in 2008- RD encouraged daily mulit vitaimin for bari patients. Pt reports she does not like to weigh on a scale, however does like to try on clothes weekly to see how they are fitting. Pt reports increasing mounjaro to 12.5 weekly stating today. All Pt's questions were answered during this encounter.  History includes:   Past Medical History:  Diagnosis Date   Anemia    Fibrocystic breast    Hyperglycemia    Hypertension    Pre-diabetes     Medications include:   Current Outpatient Medications:    Multiple Vitamin (MULTIVITAMIN WITH MINERALS) TABS tablet, Take 1 tablet by mouth daily., Disp: , Rfl:    Tirzepatide (MOUNJARO Alvo), Inject 15 mg into the skin once a week., Disp: , Rfl:    diphenhydrAMINE HCl (ZZZQUIL) 50 MG/30ML LIQD, Take 50 mg by  mouth at bedtime as needed (sleep)., Disp: , Rfl:    docusate sodium  (COLACE) 100 MG capsule, Take 1 capsule (100 mg total) by mouth 2 (two) times daily as needed. (Patient not taking: Reported on 03/06/2024), Disp: 30 capsule, Rfl: 2   ferrous sulfate (FERROUSUL) 325 (65 FE) MG tablet, Take 325 mg by mouth 2 (two) times daily. (Patient not taking: Reported on 03/06/2024), Disp: , Rfl:    ibuprofen  (ADVIL ,MOTRIN ) 600 MG tablet, Take 1 tablet (600 mg total) by mouth every 6 (six) hours as needed., Disp: 60 tablet, Rfl: 0   Lorcaserin HCl (BELVIQ) 10 MG TABS, Take 10 mg by mouth 2 (two) times daily. (Patient not taking: Reported on 03/06/2024), Disp: , Rfl:    metFORMIN (GLUCOPHAGE) 500 MG tablet, Take 500 mg by mouth daily.  (Patient not taking: Reported on 03/06/2024), Disp: , Rfl:    ondansetron  (ZOFRAN ) 4 MG tablet, Take 1 tablet (4 mg total) by mouth every 6 (six) hours as needed for nausea. (Patient not taking: Reported on 03/06/2024), Disp: 20 tablet, Rfl: 0   vitamin C (ASCORBIC ACID) 500 MG tablet, Take 500 mg by mouth daily. (Patient not taking: Reported on 03/06/2024), Disp: , Rfl:   Labs noted:  A1c 6.1%, total cholesterol 227*, TG 162*, HDL 38, LDL 156* obtained 1/61/0960       Diabetes Self-Management Education - 03/06/24 1605       Visit Information  Visit Type First/Initial      Initial Visit   Diabetes Type Type 2    Date Diagnosed 2022    Are you currently following a meal plan? Yes    What type of meal plan do you follow? low carb 20 grams per day    Are you taking your medications as prescribed? Yes      Health Coping   How would you rate your overall health? Good      Psychosocial Assessment   Patient Belief/Attitude about Diabetes Defeat/Burnout    What is the hardest part about your diabetes right now, causing you the most concern, or is the most worrisome to you about your diabetes?   Making healty food and beverage choices    Self-care barriers None     Self-management support Doctor's office    Other persons present Patient    Patient Concerns Nutrition/Meal planning    Special Needs None    Preferred Learning Style No preference indicated    Learning Readiness Change in progress    How often do you need to have someone help you when you read instructions, pamphlets, or other written materials from your doctor or pharmacy? 1 - Never    What is the last grade level you completed in school? masters      Pre-Education Assessment   Patient understands the diabetes disease and treatment process. Needs Instruction    Patient understands incorporating nutritional management into lifestyle. Needs Instruction    Patient undertands incorporating physical activity into lifestyle. Needs Instruction    Patient understands using medications safely. Needs Instruction    Patient understands monitoring blood glucose, interpreting and using results Needs Instruction    Patient understands prevention, detection, and treatment of acute complications. Needs Instruction    Patient understands prevention, detection, and treatment of chronic complications. Needs Instruction    Patient understands how to develop strategies to address psychosocial issues. Needs Instruction    Patient understands how to develop strategies to promote health/change behavior. Needs Instruction      Complications   Last HgB A1C per patient/outside source 6.1 %    How often do you check your blood sugar? 0 times/day (not testing)    Number of hypoglycemic episodes per month 0    Have you had a dilated eye exam in the past 12 months? Yes    Have you had a dental exam in the past 12 months? Yes    Are you checking your feet? Yes    How many days per week are you checking your feet? 7      Dietary Intake   Breakfast skips 4/d/w or beef bacon, eggs, wheat toast, water    Lunch chicken salad made with mayo cranberries, and nuts, 5 pita chips, water or eats out at [employer] or pizza,  water    Snack (afternoon) popcorn mini bag    Dinner 4 inch steak and cheese sub sandwich with mayo, grilled onions, lettuce, tomato or air fried chicken or salmon with green beans or sweet potato, water    Beverage(s) water, mushroom chia, wine      Activity / Exercise   Activity / Exercise Type Moderate (swimming / aerobic walking)    How many days per week do you exercise? 3    How many minutes per day do you exercise? 60    Total minutes per week of exercise 180      Patient Education   Previous Diabetes Education No  Disease Pathophysiology Definition of diabetes, type 1 and 2, and the diagnosis of diabetes    Healthy Eating Plate Method;Role of diet in the treatment of diabetes and the relationship between the three main macronutrients and blood glucose level;Food label reading, portion sizes and measuring food.;Carbohydrate counting    Being Active Role of exercise on diabetes management, blood pressure control and cardiac health.    Medications Reviewed patients medication for diabetes, action, purpose, timing of dose and side effects.    Monitoring Daily foot exams;Identified appropriate SMBG and/or A1C goals.    Acute complications Discussed and identified patients' prevention, symptoms, and treatment of hyperglycemia.    Chronic complications Retinopathy and reason for yearly dilated eye exams;Dental care    Diabetes Stress and Support Role of stress on diabetes;Worked with patient to identify barriers to care and solutions;Identified and addressed patients feelings and concerns about diabetes    Preconception care --   n/a   Lifestyle and Health Coping Helped patient develop diabetes management plan for (enter comment)   weight managemnt     Individualized Goals (developed by patient)   Nutrition Follow meal plan discussed    Physical Activity Exercise 5-7 days per week;60 minutes per day    Medications take my medication as prescribed    Problem Solving Sleep  Pattern;Eating Pattern    Reducing Risk do foot checks daily    Health Coping Ask for help with psychological, social, or emotional issues      Post-Education Assessment   Patient understands the diabetes disease and treatment process. Needs Review    Patient understands incorporating nutritional management into lifestyle. Needs Review    Patient undertands incorporating physical activity into lifestyle. Needs Review    Patient understands using medications safely. Needs Review    Patient understands monitoring blood glucose, interpreting and using results Needs Review    Patient understands prevention, detection, and treatment of acute complications. Needs Review    Patient understands prevention, detection, and treatment of chronic complications. Needs Review    Patient understands how to develop strategies to address psychosocial issues. Needs Review    Patient understands how to develop strategies to promote health/change behavior. Needs Review      Outcomes   Expected Outcomes Demonstrated interest in learning. Expect positive outcomes    Future DMSE 3-4 months    Program Status Not Completed             Individualized Plan for Diabetes Self-Management Training:   Learning Objective:  Patient will have a greater understanding of diabetes self-management. Patient education plan is to attend individual and/or group sessions per assessed needs and concerns.   Plan:   Patient Instructions  1- Daily multivitamin for bariatric patients   https://www.bariatricadvantage.com/ or  https://celebratevitamins.com/collections/multivitamins   2- Consistent Carbohydrate intake  Aim for 30 grams of carbohydrate per meal using the plate planner for balance    Expected Outcomes:  Demonstrated interest in learning. Expect positive outcomes  Education material provided: ADA - How to Thrive: A Guide for Your Journey with Diabetes, My Plate, Snack sheet, Support group flyer, and Diabetes  Resources  If problems or questions, patient to contact team via:  Phone  Future DSME appointment: 3-4 months

## 2024-03-06 ENCOUNTER — Encounter: Payer: Self-pay | Attending: Physician Assistant | Admitting: Dietician

## 2024-03-06 DIAGNOSIS — E119 Type 2 diabetes mellitus without complications: Secondary | ICD-10-CM | POA: Diagnosis present

## 2024-03-06 DIAGNOSIS — Z6841 Body Mass Index (BMI) 40.0 and over, adult: Secondary | ICD-10-CM | POA: Insufficient documentation

## 2024-03-06 DIAGNOSIS — Z713 Dietary counseling and surveillance: Secondary | ICD-10-CM | POA: Diagnosis not present

## 2024-03-06 NOTE — Patient Instructions (Addendum)
 1- Daily multivitamin for bariatric patients   https://www.bariatricadvantage.com/ or  https://celebratevitamins.com/collections/multivitamins   2- Consistent Carbohydrate intake  Aim for 30 grams of carbohydrate per meal using the plate planner for balance

## 2024-05-01 ENCOUNTER — Ambulatory Visit: Admitting: Dietician

## 2024-07-25 ENCOUNTER — Other Ambulatory Visit: Payer: Self-pay | Admitting: Physician Assistant

## 2024-07-25 DIAGNOSIS — Z1231 Encounter for screening mammogram for malignant neoplasm of breast: Secondary | ICD-10-CM

## 2024-08-02 ENCOUNTER — Encounter: Payer: Self-pay | Admitting: Emergency Medicine

## 2024-08-02 ENCOUNTER — Other Ambulatory Visit: Payer: Self-pay

## 2024-08-02 DIAGNOSIS — Z5321 Procedure and treatment not carried out due to patient leaving prior to being seen by health care provider: Secondary | ICD-10-CM | POA: Insufficient documentation

## 2024-08-02 DIAGNOSIS — R1110 Vomiting, unspecified: Secondary | ICD-10-CM | POA: Diagnosis not present

## 2024-08-02 DIAGNOSIS — R1031 Right lower quadrant pain: Secondary | ICD-10-CM | POA: Insufficient documentation

## 2024-08-02 LAB — COMPREHENSIVE METABOLIC PANEL WITH GFR
ALT: 18 U/L (ref 0–44)
AST: 24 U/L (ref 15–41)
Albumin: 4.1 g/dL (ref 3.5–5.0)
Alkaline Phosphatase: 56 U/L (ref 38–126)
Anion gap: 13 (ref 5–15)
BUN: 22 mg/dL — ABNORMAL HIGH (ref 6–20)
CO2: 21 mmol/L — ABNORMAL LOW (ref 22–32)
Calcium: 9.4 mg/dL (ref 8.9–10.3)
Chloride: 102 mmol/L (ref 98–111)
Creatinine, Ser: 1.02 mg/dL — ABNORMAL HIGH (ref 0.44–1.00)
GFR, Estimated: >60 mL/min (ref >60)
Glucose, Bld: 120 mg/dL — ABNORMAL HIGH (ref 70–99)
Potassium: 4.1 mmol/L (ref 3.5–5.1)
Sodium: 136 mmol/L (ref 135–145)
Total Bilirubin: 0.6 mg/dL (ref 0.0–1.2)
Total Protein: 8.2 g/dL — ABNORMAL HIGH (ref 6.5–8.1)

## 2024-08-02 LAB — CBC
HCT: 38.6 % (ref 36.0–46.0)
Hemoglobin: 12.2 g/dL (ref 12.0–15.0)
MCH: 26 pg (ref 26.0–34.0)
MCHC: 31.6 g/dL (ref 30.0–36.0)
MCV: 82.1 fL (ref 80.0–100.0)
Platelets: 415 K/uL — ABNORMAL HIGH (ref 150–400)
RBC: 4.7 MIL/uL (ref 3.87–5.11)
RDW: 15.2 % (ref 11.5–15.5)
WBC: 9.6 K/uL (ref 4.0–10.5)
nRBC: 0 % (ref 0.0–0.2)

## 2024-08-02 LAB — URINALYSIS, ROUTINE W REFLEX MICROSCOPIC
Bacteria, UA: NONE SEEN
Bilirubin Urine: NEGATIVE
Glucose, UA: NEGATIVE mg/dL
Ketones, ur: NEGATIVE mg/dL
Leukocytes,Ua: NEGATIVE
Nitrite: NEGATIVE
Protein, ur: NEGATIVE mg/dL
Specific Gravity, Urine: 1.028 (ref 1.005–1.030)
pH: 5 (ref 5.0–8.0)

## 2024-08-02 NOTE — ED Triage Notes (Signed)
 Pt arrive POV and reports n/v tonight while eating dinner followed by sudden onset of RLQ pain, described as sharp cramping, intermittent, 10/10. Pt vomited only once but continues to have lower abd pain.

## 2024-08-02 NOTE — ED Notes (Signed)
 Green and lav tubes sent to lab.. pt provided w/ urine cup and is aware of need for sample

## 2024-08-03 ENCOUNTER — Emergency Department
Admission: EM | Admit: 2024-08-03 | Discharge: 2024-08-03 | Discharge status: Against Medical Advice | Attending: Emergency Medicine | Admitting: Emergency Medicine

## 2024-08-03 LAB — LIPASE, BLOOD: Lipase: 21 U/L (ref 11–51)

## 2024-08-31 ENCOUNTER — Ambulatory Visit
Admission: RE | Admit: 2024-08-31 | Discharge: 2024-08-31 | Disposition: A | Discharge status: Home / Self Care | Source: Ambulatory Visit | Attending: Physician Assistant | Admitting: Physician Assistant

## 2024-08-31 DIAGNOSIS — Z1231 Encounter for screening mammogram for malignant neoplasm of breast: Secondary | ICD-10-CM | POA: Insufficient documentation
